# Patient Record
Sex: Female | Born: 1955 | Race: White | Hispanic: No | Marital: Married | Smoking: Former smoker
Health system: Southern US, Community
[De-identification: ages and names within clinical notes are randomized; demographics above are authoritative.]

## PROBLEM LIST (undated history)

## (undated) DIAGNOSIS — D649 Anemia, unspecified: Secondary | ICD-10-CM

## (undated) DIAGNOSIS — G4730 Sleep apnea, unspecified: Secondary | ICD-10-CM

## (undated) DIAGNOSIS — I10 Essential (primary) hypertension: Secondary | ICD-10-CM

## (undated) DIAGNOSIS — E039 Hypothyroidism, unspecified: Secondary | ICD-10-CM

## (undated) DIAGNOSIS — F419 Anxiety disorder, unspecified: Secondary | ICD-10-CM

## (undated) DIAGNOSIS — F32A Depression, unspecified: Secondary | ICD-10-CM

## (undated) DIAGNOSIS — C801 Malignant (primary) neoplasm, unspecified: Secondary | ICD-10-CM

## (undated) DIAGNOSIS — E119 Type 2 diabetes mellitus without complications: Secondary | ICD-10-CM

## (undated) DIAGNOSIS — E079 Disorder of thyroid, unspecified: Secondary | ICD-10-CM

## (undated) DIAGNOSIS — F329 Major depressive disorder, single episode, unspecified: Secondary | ICD-10-CM

## (undated) HISTORY — PX: APPENDECTOMY: SHX54

## (undated) HISTORY — PX: BREAST ENHANCEMENT SURGERY: SHX7

## (undated) HISTORY — PX: BACK SURGERY: SHX140

## (undated) HISTORY — PX: OTHER SURGICAL HISTORY: SHX169

## (undated) HISTORY — PX: ROTATOR CUFF REPAIR: SHX139

---

## 2007-08-14 ENCOUNTER — Inpatient Hospital Stay (HOSPITAL_COMMUNITY): Admission: RE | Admit: 2007-08-14 | Discharge: 2007-08-17 | Payer: Self-pay | Admitting: Orthopaedic Surgery

## 2008-03-28 ENCOUNTER — Ambulatory Visit (HOSPITAL_COMMUNITY): Admission: RE | Admit: 2008-03-28 | Discharge: 2008-03-28 | Payer: Self-pay | Admitting: Orthopaedic Surgery

## 2011-01-22 NOTE — Op Note (Signed)
Lori [name], Lori [name]             ACCOUNT NO.:  [id number]   MEDICAL RECORD NO.:  [id number]          PATIENT TYPE:  INP   LOCATION:  2899                         FACILITY:  [facility]   PHYSICIAN:  Mark C. Ophelia Charter, M.D.    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   PROCEDURE:  L4-5 decompression central with right L4-5 TLIF 9 mm Biomet  PEEK interbody spacer, local bone interbody grafting, bilateral gutter  fusion intertransverse process and pedicle screw instrumentation,  pedicle bone marrow aspirate, VTOSS at L4 and L5.   SURGEON:  Annell Greening, M.D.   ASSISTANT:  Maud Deed, PA-C.   ANESTHESIA:  GOT.   ESTIMATED BLOOD LOSS:  400 mL.   This patient had on-the-job injury with progressive pain, synovial cyst  with spondylolisthesis degenerative in nature and acute synovial cyst  formation causing spinal stenosis with instability and anterolisthesis.  She has had persistent pain initially left leg pain and now more right  leg pain.   The patient was taken to the operating room.  After intubation and Foley  placement placed in the prone position, careful padding and positioning  elbows at 90 with axillary roll padding anterior to the shoulder.  Back  was prepped with DuraPrep after placement of a warmer over the scapular  region by Anesthesia.  Prepping squared with towels, sterile skin  marker, Betadine Vi-Drape and laminectomy sheet was applied.  Time-out  procedure was taken and confirmed.  The patient had Ancef that had  finished dripping in and was started just after intubation.   Midline incision was made.  Subperiosteal dissection onto the spinous  processes  out to the facet joints.  The transverse processes were  identified and C-arm was draped, brought in and confirmed that the  lateral disk was the planned 4-5.  Two Kochers were placed at the  planned level for decompression in line with the pedicle of 4 and also  5.  Central decompression was performed.  There was hypertrophic  ligament and a large synovial cyst which had a combination of synovial  tissue plus granulomatous tissue in addition to hypertrophic ligaments  and some spur formation.  With the patient in the prone position, she  had reduced her degenerative spondylolisthesis and decompression was  performed removing chunks out until the disk space was visualized on  both sides and foramina was enlarged.  On the right side, the facet was  taken out following the disk space out laterally.  Nerve root above was  identified as it slipped underneath the pedicles carefully protected.  There were several very large veins which were coagulated with bipolar  cautery.  Paddies and Gelfoam were placed and diskectomy was performed  with decompression at the 4-5 space checking with C-arm intermittently.  Disk was moved up to the anterior longitudinal ligament.  Anterior  portion  of the disk was intact.  Combination of up angled large  curettes, right and left angled curettes, 7 and 9 box cutter, 7 and 9  chisel cutters, up and straight large pituitaries were all used off of  the [employer].  Once diskectomy was performed, spot photos  taken with the 7 trial in place.  It needed a little bit larger side and  a 9 mm spacer was then inserted with good fit.  The bone that had been  obtained from the laminectomy spinous process removal and removal of the  facet for the TLIF on the right side were meticulously cleaned of soft  tissue by the scrub tech and were carefully packed anteriorly into the  disk space with a bone impactor.  Then the 9-mm spacer was inserted.  There was not enough  bone to pack both anterior and also into the spacer  and still leave enough left for the gutter fusion.  Once the spacer was  in excellent position, it was checked partially going in to visualize  the three dots and then checked after it got completely in place and it  was exactly midline and was in excellent position in lateral.  Next  pedicle screws were inserted using the small pedicle starter checking  under fluoroscopy adjustment followed by the joystick hand advanced  using the MRI scan in the room with visualization of the pedicle angle.  Pedicle feeler after confirmation checking the joystick 40 mm in depth.  Then a 40 mm tap checking with C-arm and then pedicle aspiration of bone  marrow and then placing on the 10 mL VTOSS strip.  There was enough bone  marrow aspirate from three of the four pedicles.  After all pedicle  screws were placed, a 40 mm rod was selected and was first compressed on  the left side which of the opposite side from the right TLIF cage  placement.  Transverse processes were decorticated just prior to the  screw being inserted.  Rod was compressed on the right side and as the  rod tightened down and clicked confirming 110 pounds torque using the  torque wrench, the tip of the torque screwdriver broke off inside the  cap flush with the top of the cap.  The second torque screwdriver was  used and then the bottom side of the second torque screwdriver also  snapped off from metal fatigue.  Both of them had compressed and clicked  at 110 pounds, were extremely tight and the tip of the screwdriver was  meshed into the titanium head stable and had no way of coming out.  The  only way to remove the tip of the screwdriver would have been to remove  the entire screw which would have required dividing the rods.  Since the  screwdriver broke inside the screw, the patient only had one level  disease, all other levels are normal, it was best just to leave the  broken tips  inside.  They are not going to move and go any place.  Using  the screw inserter on the opposite side and a vice grip, the opposite  side was tightened down after being compressed.  Fluoro pictures were  taken AP and lateral showing good position of the cage, good bone  anteriorly, bilateral gutter fusion with the VTOSS and remaining pieces  of bone placed between the intertransverse process and on the up sweep  that had been decorticated with the bur.  The viper retractor  was  carefully removed and deep tissue closed with the 0 Vicryl.  A drain had  planned to be inserted, however, the operative field was dry.  Remaining  paddies and Gelfoam had all been removed and both nerve roots were  checked.  Palpation around all nerve roots showed no areas of  compression and the cage was in excellent position.  The wound was  irrigated.  Deep fascia closed with 0 Vicryl, 2-0 Vicryl subcutaneous  tissue, 4-0 Vicryl subcuticular closure.  Tincture of Benzoin, Steri-  Strips, postop dressing, supine position, extubation and transferred to  recovery room in stable condition.  Instrument and needle count was  correct.      Mark C. Ophelia Charter, M.D.  Electronically Signed     MCY/MEDQ  D:  08/14/2007  T:  08/14/2007  Job:  [id number]

## 2011-01-22 NOTE — Op Note (Signed)
Lori [name], Lori [name]             ACCOUNT NO.:  [id number]   MEDICAL RECORD NO.:  [id number]          PATIENT TYPE:  AMB   LOCATION:  SDS                          FACILITY:  [facility]   PHYSICIAN:  Mark C. Ophelia Charter, M.D.    DATE OF BIRTH:  Nov 07, 1955   DATE OF PROCEDURE:  03/28/2008  DATE OF DISCHARGE:  03/28/2008                               OPERATIVE REPORT   PREOPERATIVE DIAGNOSIS:  Right shoulder biceps tendon tear and rotator  cuff tear.   POSTOPERATIVE DIAGNOSIS:  Right shoulder biceps tendon tear and rotator  cuff tear.   PROCEDURE:  1. Diagnostic and operative arthroscopy, right shoulder; arthroscopic      debridement of biceps tendon tear, labral debridement, and rotator      cuff debridement.  2. Mini-open rotator cuff repair and biceps tenodesis.   SURGEON:  Mark C. Ophelia Charter, MD   ANESTHESIA:  GOT plus preoperative scalene block and 3 mL local.   PROCEDURE NOTE:  After induction of general anesthesia, orotracheal  intubation with preoperative scalene block, the patient was placed in a  beach-chair position, standard prepping and draping was performed.  Time-  out checklist was completed and Ancef was given prophylactically.  Arthroscope was then introduced from posterior approach.  Anterior  portal was made inferior to the biceps tendon.  The biceps tendon  actually showed significant fraying of the patient's subluxed __________  joint was actually tucked down anteroinferiorly with the stump being  caught between the glenohumeral joint inferiorly.  Stump was pulled  loose with a probe, debrided with the shaver.  There was tearing of the  superior labrum.  This was trimmed and smoothed.  There was no  significant glenohumeral arthropathy.   Initially the rotator cuff showed tearing and there was egress of fluid  out and the edge of the rotator cuff was trimmed arthroscopically.  Next, scope was removed.  Sterile skin marker was used.  An incision was  made along the  anterior aspect of the acromion.  Deltoid was peeled off  with electrocautery.  There was a prominent spur which was removed with  three-quarter straight osteotome, rasped underneath.  Distal clavicle  was not prominent.  Small bleeder was present adjacent to the CA  ligament, usual position, which was covered with cautery.  There was  extensive deltoid bursitis and this was trimmed and debrided.  After  debridement, the full-thickness rotator cuff tear was visualized which  was moderate to large in size measuring around 2 cm to 2.5.  Shoulder  was flexed.  Biceps tendon was palpated, delivered with the right-angle  clamp.  K-wire was drilled.  Then, a 5 mm Bio-Tenodesis screw was placed  after a #2 FiberWire was woven through the tendon and secured with 5 x 5-  mm Bio-Tenodesis corkscrew with one end of the suture going through the  corkscrew and once the tendon had been advanced in through the hole, the  screw was countersunk.  Two ends of the suture were tied locking it  down.  Attention was then turned to the rotator cuff.  The attachment  area was debrided  with bare to bleeding bone surface.  Two UltraFix  anchors were placed and then sutures were placed on the tendon pointing  it down to the freshened bone surface.  Deltoid was repaired with  nonabsorbable sutures #2 from the UltraFix repairing the muscle directly  back to the  acromion.  A 2-0 Vicryl subcutaneous tissue and 4-0 Vicryl subcuticular  closure, nylon sutures in the portals.  Instrument count and needle  count was correct.  Postoperative dressing with sling was applied and  the patient was transferred to the recovery room in stable condition.      Mark C. Ophelia Charter, M.D.  Electronically Signed     MCY/MEDQ  D:  03/28/2008  T:  03/29/2008  Job:  [id number]

## 2011-01-25 NOTE — Discharge Summary (Signed)
[name], [name]             ACCOUNT NO.:  [id number]   MEDICAL RECORD NO.:  [id number]          PATIENT TYPE:  INP   LOCATION:  5002                         FACILITY:  [facility]   PHYSICIAN:  Mark C. Ophelia Charter, M.D.    DATE OF BIRTH:  08-10-1956   DATE OF ADMISSION:  08/14/2007  DATE OF DISCHARGE:  08/17/2007                               DISCHARGE SUMMARY   FINAL DIAGNOSIS:  L4-5 instability anterolisthesis.   ADDITIONAL DIAGNOSES:  1. Degenerative spondylolisthesis.  2. Anxiety and depression.  3. Acute blood loss anemia.   ADMISSION LABS:  Mild sinus bradycardia, possible left atrial  enlargement.  CBC:  hemoglobin 14.4, normal differential.  Chemistry  panel normal. Urinalysis normal.  Blood type O positive.   This 55 year old female has had degenerative spondylolisthesis with  spinal stenosis, biforaminal stenosis, this is an isolated level of  degeneration.  She has failed conservative treatment including epidural  steroids.  She has increased pain with activity and paresthesias and L5  nerve root symptoms.  She is admitted after informed consent for one  level L4-5 T lift with pedicle instrumentation, bilateral effusion.   MEDICATIONS:  1. Fioricet, Codeine #3 two p.o. q.5 hours p.r.n. headaches, uses 8      tablets per day x10 years.  2. Protonix 40 mg b.i.d. a.c.  3. Vicodin 5/500 one p.o. b.i.d. p.r.n.  4. Triamterene & hydrochlorothiazide 37.5/25 one p.o. daily.  5. Cymbalta 60 mg daily.  6. Xanax 0.5 mg p.o. t.i.d.  7. Chantix 1 mg one p.o. b.i.d.  8. Fish oil 1200 mg daily.  9. Stool softener 2-3 q.a.m.  10.Megavitamins one p.o. daily.  11.Frova for severe migraine headaches.   HOSPITAL COURSE:  The patient was admitted, underwent central  decompression L4-5, pedicle screw instrumentation with Bio Met Polaris  instrumentation.  Pedicle aspiration and bilateral gutter fusions with  local bone.  Hemoglobin postoperatively was 9.6.  Sodium was 129.  She  had  some decrease in her p.o. fluids and her sodium improved.  She was  ambulatory with her brace with physical therapy.  She received  mechanical prophylaxis for DVT prevention.  She was walking in the  halls, taking OxyContin b.i.d., Oxi-IR for breakthrough pain, Robaxin.  X-rays showed excellent position and alignment task.  She had good  relief from her preoperative L5 nerve root symptoms.  Incision was dry.  Office followup 1 week post discharge.   FINAL DIAGNOSES:  1. Instability.  2. Acute blood loss anemia.  3. Degenerative spondylolisthesis.      Mark C. Ophelia Charter, M.D.  Electronically Signed     MCY/MEDQ  D:  09/14/2007  T:  09/14/2007  Job:  [id number]

## 2011-06-07 LAB — DIFFERENTIAL
Basophils Relative: 1
Eosinophils Absolute: 0.1
Eosinophils Relative: 1
Monocytes Absolute: 0.8
Neutro Abs: 4.7
Neutrophils Relative %: 53

## 2011-06-07 LAB — URINALYSIS, ROUTINE W REFLEX MICROSCOPIC
Bilirubin Urine: NEGATIVE
Glucose, UA: NEGATIVE
Hgb urine dipstick: NEGATIVE
Specific Gravity, Urine: 1.011
Urobilinogen, UA: 0.2
pH: 7

## 2011-06-07 LAB — COMPREHENSIVE METABOLIC PANEL
Albumin: 3.6
BUN: 9
Calcium: 9.4
Chloride: 92 — ABNORMAL LOW
Potassium: 4.1

## 2011-06-07 LAB — CBC
Hemoglobin: 13.8
MCV: 90.8
Platelets: 271
RDW: 15.1

## 2011-06-07 LAB — PROTIME-INR: INR: 0.9

## 2011-06-17 LAB — HEMOGLOBIN AND HEMATOCRIT, BLOOD
HCT: 28.3 — ABNORMAL LOW
Hemoglobin: 10.9 — ABNORMAL LOW

## 2011-06-17 LAB — COMPREHENSIVE METABOLIC PANEL
AST: 20
Albumin: 3.6
Alkaline Phosphatase: 101
CO2: 29
Chloride: 99
Creatinine, Ser: 0.62
Glucose, Bld: 80
Potassium: 3.8
Sodium: 134 — ABNORMAL LOW

## 2011-06-17 LAB — URINALYSIS, ROUTINE W REFLEX MICROSCOPIC
Glucose, UA: NEGATIVE
Hgb urine dipstick: NEGATIVE
Ketones, ur: NEGATIVE
Specific Gravity, Urine: 1.011

## 2011-06-17 LAB — BASIC METABOLIC PANEL
BUN: 7
CO2: 28
Chloride: 101
Creatinine, Ser: 0.53
Creatinine, Ser: 0.7
GFR calc Af Amer: >60

## 2011-06-17 LAB — CBC
MCV: 92.7
RDW: 12.9
WBC: 6.8

## 2011-06-17 LAB — TYPE AND SCREEN: ABO/RH(D): O POS

## 2011-06-17 LAB — ABO/RH: ABO/RH(D): O POS

## 2011-06-17 LAB — PROTIME-INR
INR: 0.9
Prothrombin Time: 12.2

## 2011-06-17 LAB — DIFFERENTIAL
Lymphocytes Relative: 44
Lymphs Abs: 3
Neutrophils Relative %: 43

## 2014-06-06 ENCOUNTER — Ambulatory Visit (INDEPENDENT_AMBULATORY_CARE_PROVIDER_SITE_OTHER): Payer: BLUE CROSS/BLUE SHIELD

## 2014-06-06 ENCOUNTER — Telehealth: Payer: Self-pay | Admitting: *Deleted

## 2014-06-06 ENCOUNTER — Ambulatory Visit (INDEPENDENT_AMBULATORY_CARE_PROVIDER_SITE_OTHER): Payer: BLUE CROSS/BLUE SHIELD | Admitting: Sports Medicine

## 2014-06-06 ENCOUNTER — Encounter: Payer: Self-pay | Admitting: Sports Medicine

## 2014-06-06 VITALS — BP 97/62 | HR 79 | Ht 60.0 in | Wt 139.0 lb

## 2014-06-06 DIAGNOSIS — Q762 Congenital spondylolisthesis: Secondary | ICD-10-CM | POA: Diagnosis not present

## 2014-06-06 DIAGNOSIS — R209 Unspecified disturbances of skin sensation: Secondary | ICD-10-CM

## 2014-06-06 DIAGNOSIS — R202 Paresthesia of skin: Secondary | ICD-10-CM | POA: Insufficient documentation

## 2014-06-06 DIAGNOSIS — R200 Anesthesia of skin: Secondary | ICD-10-CM | POA: Insufficient documentation

## 2014-06-06 MED ORDER — GABAPENTIN 300 MG PO CAPS: ORAL_CAPSULE | ORAL | Status: AC

## 2014-06-06 NOTE — Progress Notes (Signed)
   Subjective:    I'm seeing this patient as a consultation for:  Dr. Trudi Ida  CC: Left toe numbness  HPI: This is a very pleasant 58 year old female who comes in with a long history of numbness on her left and occasionally right second toes. The numbness does travel from her back. She has seen a couple of podiatrists who have recommended surgical procedures on her toes. She had an injection sometime ago that also resulted in mild temporary relief. Numbness and tingling as well as a burning sensation is worse with driving a car and sitting for long periods of time. On further questioning she does have diabetes mellitus type 2 of unknown control, and has a history of an L4-L5 lumbar discectomy with fusion, she had good temporary relief but unfortunately a recurrence of back pain after surgery. Pain is moderate, persistent.  Past medical history, Surgical history, Family history not pertinant except as noted below, Social history, Allergies, and medications have been entered into the medical record, reviewed, and no changes needed.   Review of Systems: No headache, visual changes, nausea, vomiting, diarrhea, constipation, dizziness, abdominal pain, skin rash, fevers, chills, night sweats, weight loss, swollen lymph nodes, body aches, joint swelling, muscle aches, chest pain, shortness of breath, mood changes, visual or auditory hallucinations.   Objective:   General: Well Developed, well nourished, and in no acute distress.  Neuro/Psych: Alert and oriented x3, extra-ocular muscles intact, able to move all 4 extremities, sensation grossly intact. Skin: Warm and dry, no rashes noted.  Respiratory: Not using accessory muscles, speaking in full sentences, trachea midline.  Cardiovascular: Pulses palpable, no extremity edema. Abdomen: Does not appear distended. Bilateral feet: No visible erythema or swelling. Range of motion is full in all directions. Strength is 5/5 in all directions. No  hallux valgus. No pes cavus or pes planus. No abnormal callus noted. No pain over the navicular prominence, or base of fifth metatarsal. No tenderness to palpation of the calcaneal insertion of plantar fascia. No pain at the Achilles insertion. No pain over the calcaneal bursa. No pain of the retrocalcaneal bursa. No tenderness to palpation over the tarsals, metatarsals, or phalanges. No hallux rigidus or limitus. No tenderness palpation over interphalangeal joints. No pain, numbness, tingling, or burning with compression of the metatarsal heads. Neurovascularly intact distally.  X-rays reviewed and do show hardware at L4-L5 with persistent 4 mm anterolisthesis of L4 on L5.  Impression and Recommendations:   This case required medical decision making of moderate complexity.

## 2014-06-06 NOTE — Assessment & Plan Note (Signed)
This is bilateral, left worse than right into the second toe, worse when sitting for long periods of time in a patient with a history of an L4-L5 fusion continued back pain, and diabetes. Unclear control, we're going to check a CBC, metabolic panel, M1O, TSH, and B12. As she is post lumbar fusion we are also going to obtain a new lumbar spine x-ray and MRI with IV contrast. Starting gabapentin for symptom relief. Stop metformin 48 hours before MRI. Return to see me to go over MRI results.

## 2014-06-06 NOTE — Telephone Encounter (Signed)
MRI auth approval [id number] expires 07/05/14. Margette Fast, CMA

## 2014-06-06 NOTE — Patient Instructions (Signed)
Please stop metformin 48 hours before your MRI.

## 2014-06-07 LAB — COMPREHENSIVE METABOLIC PANEL
ALT: 120 U/L — ABNORMAL HIGH (ref 0–35)
BUN: 14 mg/dL (ref 6–23)
CO2: 26 mEq/L (ref 19–32)
Calcium: 8.8 mg/dL (ref 8.4–10.5)
Chloride: 98 mEq/L (ref 96–112)
Creat: 0.69 mg/dL (ref 0.50–1.10)
Glucose, Bld: 76 mg/dL (ref 70–99)

## 2014-06-07 LAB — COMPREHENSIVE METABOLIC PANEL WITH GFR
AST: 331 U/L — ABNORMAL HIGH (ref 0–37)
Albumin: 3.7 g/dL (ref 3.5–5.2)
Alkaline Phosphatase: 75 U/L (ref 39–117)
Potassium: 3.4 meq/L — ABNORMAL LOW (ref 3.5–5.3)
Sodium: 135 meq/L (ref 135–145)
Total Bilirubin: 0.4 mg/dL (ref 0.2–1.2)
Total Protein: 6.2 g/dL (ref 6.0–8.3)

## 2014-06-07 LAB — CBC
HCT: 37.9 % (ref 36.0–46.0)
Hemoglobin: 13.1 g/dL (ref 12.0–15.0)
MCH: 31.5 pg (ref 26.0–34.0)
MCHC: 34.6 g/dL (ref 30.0–36.0)
MCV: 91.1 fL (ref 78.0–100.0)
Platelets: 204 10*3/uL (ref 150–400)
RBC: 4.16 MIL/uL (ref 3.87–5.11)
RDW: 13.8 % (ref 11.5–15.5)
WBC: 4.1 10*3/uL (ref 4.0–10.5)

## 2014-06-07 LAB — TSH: TSH: 1.926 u[IU]/mL (ref 0.350–4.500)

## 2014-06-07 LAB — HEMOGLOBIN A1C
Hgb A1c MFr Bld: 5.3 % (ref <5.7)
Mean Plasma Glucose: 105 mg/dL (ref <117)

## 2014-06-07 LAB — VITAMIN B12: Vitamin B-12: 862 pg/mL (ref 211–911)

## 2014-06-13 ENCOUNTER — Ambulatory Visit (INDEPENDENT_AMBULATORY_CARE_PROVIDER_SITE_OTHER): Payer: BC Managed Care – PPO

## 2014-06-13 DIAGNOSIS — M4326 Fusion of spine, lumbar region: Secondary | ICD-10-CM

## 2014-06-13 MED ORDER — GADOBENATE DIMEGLUMINE 529 MG/ML IV SOLN
12.0000 mL | Freq: Once | INTRAVENOUS | Status: AC | PRN
  Administered 2014-06-13: 12 mL via INTRAVENOUS

## 2014-06-14 ENCOUNTER — Ambulatory Visit (INDEPENDENT_AMBULATORY_CARE_PROVIDER_SITE_OTHER): Payer: BLUE CROSS/BLUE SHIELD | Admitting: Sports Medicine

## 2014-06-14 VITALS — BP 131/88 | HR 68 | Ht 60.0 in | Wt 139.0 lb

## 2014-06-14 DIAGNOSIS — R202 Paresthesia of skin: Secondary | ICD-10-CM

## 2014-06-14 NOTE — Progress Notes (Signed)
  Subjective:    CC: Followup MRI results  HPI: This is a very pleasant 58 year old female with lumbar degenerative disc disease post L4-L5 fusion. Initially she did well but then had a recurrence of pain. Pain was in the back, worse with sitting and with flexion, with radiation down both legs but are dominantly down the right leg over the dorsum of the foot to the second toe, and over the plantar aspect of the foot, i.e. in an L5 distribution.  She has already been there in the past, we added gabapentin in a taper, she is now at approximately 300 mg twice a day and not yet noting much response. We also obtained an MRI in anticipation of interventional injection planning. She does have a history of diabetes mellitus type 2, but her hemoglobin A1c was very well controlled at the last check several weeks ago, so it's unlikely that this represents diabetic peripheral neuropathy. Metabolic panel, U38, CBC, and TSH levels were normal.  Past medical history, Surgical history, Family history not pertinant except as noted below, Social history, Allergies, and medications have been entered into the medical record, reviewed, and no changes needed.   Review of Systems: No fevers, chills, night sweats, weight loss, chest pain, or shortness of breath.   Objective:    General: Well Developed, well nourished, and in no acute distress.  Neuro: Alert and oriented x3, extra-ocular muscles intact, sensation grossly intact.  HEENT: Normocephalic, atraumatic, pupils equal round reactive to light, neck supple, no masses, no lymphadenopathy, thyroid nonpalpable.  Skin: Warm and dry, no rashes. Cardiac: Regular rate and rhythm, no murmurs rubs or gallops, no lower extremity edema.  Respiratory: Clear to auscultation bilaterally. Not using accessory muscles, speaking in full sentences.  MRI was personally reviewed, there is severe susceptibility artifact from her L4-L5 fusion apparatus, I am able to make out a  broad-based L5-S1 disc protrusion.  Impression and Recommendations:

## 2014-06-14 NOTE — Assessment & Plan Note (Signed)
Symptoms do likely represent left-sided lumbar radiculitis and L5 distribution, and likely represents adjacent level disease with a history of an L4-L5 fusion. Gabapentin has been ineffective at the current low dose, I have advised that she continue the up taper. She does have a normal CBC, metabolic panel, hemoglobin A1c, B12, and TSH levels. At this point I do think we need to proceed with a lumbar epidural, ideally on the left side at the L5-S1 level, transforaminal ideally. We could do this here however she does have an interventional pain management physician in [place], I would request that they try a left-sided L5-S1 transforaminal epidural. I would like to see her back a couple of weeks afterwards, or she can continue followup with them. I have asked her to obtain an MRI CD from radiology downstairs.

## 2015-07-09 ENCOUNTER — Emergency Department (INDEPENDENT_AMBULATORY_CARE_PROVIDER_SITE_OTHER)
Admission: EM | Admit: 2015-07-09 | Discharge: 2015-07-09 | Disposition: A | Discharge status: Home / Self Care | Payer: BLUE CROSS/BLUE SHIELD | Source: Home / Self Care

## 2015-07-09 ENCOUNTER — Encounter: Payer: Self-pay | Admitting: Emergency Medicine

## 2015-07-09 ENCOUNTER — Emergency Department (INDEPENDENT_AMBULATORY_CARE_PROVIDER_SITE_OTHER): Payer: BLUE CROSS/BLUE SHIELD

## 2015-07-09 DIAGNOSIS — J181 Lobar pneumonia, unspecified organism: Principal | ICD-10-CM

## 2015-07-09 DIAGNOSIS — R079 Chest pain, unspecified: Secondary | ICD-10-CM

## 2015-07-09 DIAGNOSIS — J189 Pneumonia, unspecified organism: Secondary | ICD-10-CM | POA: Diagnosis not present

## 2015-07-09 HISTORY — DX: Disorder of thyroid, unspecified: E07.9

## 2015-07-09 HISTORY — DX: Type 2 diabetes mellitus without complications: E11.9

## 2015-07-09 HISTORY — DX: Essential (primary) hypertension: I10

## 2015-07-09 MED ORDER — CEFDINIR 300 MG PO CAPS: 300.0000 mg | ORAL_CAPSULE | Freq: Two times a day (BID) | ORAL | Status: DC

## 2015-07-09 NOTE — ED Provider Notes (Signed)
CSN: [id number]     Arrival date & time 07/09/15  1118 History   None    Chief Complaint  Patient presents with  . Chest Pain      HPI Comments: Patient reports onset of pain in her left chest underneath her breast yesterday, worse with movement, cough, and deep inspiration.  She denies cough however.  No shortness of breath.  No fevers, chills, and sweats.  No recent URI. She states that she had pneumonia about 10 years ago.   The history is provided by the patient.    Past Medical History  Diagnosis Date  . Hypertension   . Thyroid disease   . Diabetes mellitus without complication ([place])    History reviewed. No pertinent past surgical history. Family History  Problem Relation Age of Onset  . Hypertension Father   . Cancer Sister    Social History  Substance Use Topics  . Smoking status: Never Smoker   . Smokeless tobacco: None  . Alcohol Use: No   OB History    No data available     Review of Systems No sore throat No cough No pleuritic pain No wheezing No nasal congestion No post-nasal drainage No sinus pain/pressure No itchy/red eyes No earache No hemoptysis No SOB No fever/chills No nausea No vomiting No abdominal pain No diarrhea No urinary symptoms No skin rash + fatigue No myalgias No headache Used OTC meds without relief  Allergies  Review of patient's allergies indicates no known allergies.  Home Medications   Prior to Admission medications   Medication Sig Start Date End Date Taking? Authorizing Provider  ALPRAZolam Duanne Moron) 0.5 MG tablet Take 0.5 mg by mouth at bedtime as needed for anxiety.    Historical Provider, MD  Armodafinil (NUVIGIL) 150 MG tablet Take 150 mg by mouth daily.    Historical Provider, MD  Butalbital-APAP-Caffeine 50-300-40 MG CAPS Take 2 capsules by mouth every 6 (six) hours.    Historical Provider, MD  Calcium Citrate (CITRACAL PO) Take by mouth.    Historical Provider, MD  cefdinir (OMNICEF) 300 MG capsule Take 1  capsule (300 mg total) by mouth 2 (two) times daily. 07/09/15   Kandra Nicolas, MD  Cholecalciferol (VITAMIN D) 2000 UNITS CAPS Take by mouth.    Historical Provider, MD  denosumab (PROLIA) 60 MG/ML SOLN injection Inject 60 mg into the skin every 6 (six) months. Administer in upper arm, thigh, or abdomen    Historical Provider, MD  gabapentin (NEURONTIN) 300 MG capsule One tab PO qHS for a week, then BID for a week, then TID. May double weekly to a max of 3,600mg /day 06/06/14   Silverio Decamp, MD  HYDROcodone-acetaminophen [facility]) 10-325 MG per tablet Take 1 tablet by mouth every 6 (six) hours as needed.    Historical Provider, MD  levothyroxine (SYNTHROID, LEVOTHROID) 25 MCG tablet Take 25 mcg by mouth daily before breakfast.    Historical Provider, MD  metFORMIN (GLUCOPHAGE) 500 MG tablet  02/02/14   Historical Provider, MD  Omega-3 Fatty Acids (FISH OIL) 1000 MG CAPS Take by mouth.    Historical Provider, MD  rizatriptan (MAXALT) 5 MG tablet Take 5 mg by mouth as needed for migraine. May repeat in 2 hours if needed    Historical Provider, MD  tizanidine (ZANAFLEX) 2 MG capsule Take 2 mg by mouth 3 (three) times daily.    Historical Provider, MD  valsartan (DIOVAN) 80 MG tablet Take 80 mg by mouth daily.  Historical Provider, MD  venlafaxine XR (EFFEXOR-XR) 150 MG 24 hr capsule Take 150 mg by mouth daily with breakfast.    Historical Provider, MD   Meds Ordered and Administered this Visit  Medications - No data to display  BP 90/56 mmHg  Pulse 78  Temp(Src) 98.3 F (36.8 C) (Oral)  Resp 16  Ht 5' (1.524 m)  Wt 144 lb 8 oz (65.545 kg)  BMI 28.22 kg/m2  SpO2 97% No data found.   Physical Exam  Constitutional: She is oriented to person, place, and time. She appears well-developed and well-nourished. No distress.  HENT:  Head: Normocephalic.  Right Ear: Tympanic membrane and ear canal normal.  Left Ear: Tympanic membrane and ear canal normal.  Nose: Nose normal.    Mouth/Throat: Oropharynx is clear and moist.  Eyes: Conjunctivae and EOM are normal. Pupils are equal, round, and reactive to light.  Neck: Neck supple. No JVD present.  Cardiovascular: Normal heart sounds.   Pulmonary/Chest: Breath sounds normal. No stridor. No respiratory distress. She has no wheezes. She has no rales. She exhibits tenderness.    There is mild tenderness to palpation over the left anterior lower ribs as noted on diagram.    Abdominal: Soft. She exhibits no mass. There is no tenderness.  Musculoskeletal: She exhibits no edema or tenderness.  Lymphadenopathy:    She has no cervical adenopathy.  Neurological: She is alert and oriented to person, place, and time.  Skin: Skin is warm and dry. No pallor.  Nursing note and vitals reviewed.   ED Course  Procedures  None  Imaging Review Dg Ribs Unilateral W/chest Left  07/09/2015  CLINICAL DATA:  Pain in LEFT anterior ribs. Pain with deep inspiration EXAM: LEFT RIBS AND CHEST - 3+ VIEW COMPARISON:  None. FINDINGS: Normal cardiac silhouette. Bilateral breast implants noted. There is a airspace density in the LEFT upper lobe obscuring the LEFT heart border. Dedicated views of the LEFT ribs no acute fractures. Healed fracture of the LEFT lateral eighth rib. IMPRESSION: 1. LEFT upper lobe pneumonia. Followup PA and lateral chest X-ray is recommended in 3-4 weeks following trial of antibiotic therapy to ensure resolution and exclude underlying malignancy. 2 . no evidence of acute rib fracture. Electronically Signed   By: Suzy Bouchard M.D.   On: 07/09/2015 12:13      MDM   1. Left upper lobe pneumonia    Begin Omnicef 300mg  BID for 10 days. Take plain guaifenesin (1200mg  extended release tabs such as Mucinex) twice daily, with plenty of water, for cough and congestion.   May take Tylenol or Norco as needed for pain. Followup with family doctor if not improved in 10 days. Patient prefers to return here in 3 weeks for  repeat chest X-ray. If symptoms become significantly worse during the night or over the weekend, proceed to the local emergency room.     Kandra Nicolas, MD 07/12/15 [phone]

## 2015-07-09 NOTE — ED Notes (Signed)
Reports onset of pain in muscle/rib area under left breast yesterday; unable to sleep last night; did do one exercise that may have contributed to discomfort. No SOB, dyspnea, diaphoresis.

## 2015-07-09 NOTE — Discharge Instructions (Signed)
Take plain guaifenesin (1200mg  extended release tabs such as Mucinex) twice daily, with plenty of water, for cough and congestion.   May take Tylenol or Norco as needed for pain. Followup with family doctor if not improved in 10 days. If symptoms become significantly worse during the night or over the weekend, proceed to the local emergency room.

## 2015-07-13 ENCOUNTER — Telehealth: Payer: Self-pay | Admitting: *Deleted

## 2015-07-17 ENCOUNTER — Ambulatory Visit: Payer: Self-pay | Admitting: "Endocrinology

## 2015-08-01 LAB — HEMOGLOBIN A1C: Hgb A1c MFr Bld: 5.9 % (ref 4.0–6.0)

## 2015-08-01 LAB — BASIC METABOLIC PANEL: SODIUM: 139 mmol/L (ref 137–147)

## 2015-08-07 ENCOUNTER — Encounter: Payer: Self-pay | Admitting: "Endocrinology

## 2015-08-07 ENCOUNTER — Ambulatory Visit (INDEPENDENT_AMBULATORY_CARE_PROVIDER_SITE_OTHER): Payer: BLUE CROSS/BLUE SHIELD | Admitting: "Endocrinology

## 2015-08-07 VITALS — BP 127/89 | HR 74 | Ht 60.0 in | Wt 138.0 lb

## 2015-08-07 DIAGNOSIS — E119 Type 2 diabetes mellitus without complications: Secondary | ICD-10-CM

## 2015-08-07 DIAGNOSIS — R631 Polydipsia: Secondary | ICD-10-CM

## 2015-08-07 DIAGNOSIS — E039 Hypothyroidism, unspecified: Secondary | ICD-10-CM

## 2015-08-07 NOTE — Progress Notes (Signed)
Subjective:    Patient ID: Lori [name], female    DOB: 15-Apr-1956, PCP Talmage Coin, MD   Past Medical History  Diagnosis Date  . Hypertension   . Thyroid disease   . Diabetes mellitus without complication [facility])    Past Surgical History  Procedure Laterality Date  . Rotator cuff repair    . Breast enhancement surgery    . Cesarean section    . Appendectomy     Social History   Social History  . Marital Status: Married    Spouse Name: N/A  . Number of Children: N/A  . Years of Education: N/A   Social History Main Topics  . Smoking status: Never Smoker   . Smokeless tobacco: None  . Alcohol Use: No  . Drug Use: No  . Sexual Activity: Not Asked   Other Topics Concern  . None   Social History Narrative   Outpatient Encounter Prescriptions as of 08/07/2015  Medication Sig  . levothyroxine (SYNTHROID, LEVOTHROID) 50 MCG tablet Take 50 mcg by mouth daily before breakfast.  . metFORMIN (GLUCOPHAGE) 500 MG tablet   . valsartan (DIOVAN) 80 MG tablet Take 80 mg by mouth daily.  Marland Kitchen ALPRAZolam (XANAX) 0.5 MG tablet Take 0.5 mg by mouth at bedtime as needed for anxiety.  . Armodafinil (NUVIGIL) 150 MG tablet Take 150 mg by mouth daily.  . Butalbital-APAP-Caffeine 50-300-40 MG CAPS Take 2 capsules by mouth every 6 (six) hours.  . Calcium Citrate (CITRACAL PO) Take by mouth.  . cefdinir (OMNICEF) 300 MG capsule Take 1 capsule (300 mg total) by mouth 2 (two) times daily.  . Cholecalciferol (VITAMIN D) 2000 UNITS CAPS Take by mouth.  . denosumab (PROLIA) 60 MG/ML SOLN injection Inject 60 mg into the skin every 6 (six) months. Administer in upper arm, thigh, or abdomen  . gabapentin (NEURONTIN) 300 MG capsule One tab PO qHS for a week, then BID for a week, then TID. May double weekly to a max of 3,600mg /day  . HYDROcodone-acetaminophen (NORCO) 10-325 MG per tablet Take 1 tablet by mouth every 6 (six) hours as needed.  . Omega-3 Fatty Acids (FISH OIL) 1000 MG CAPS Take by  mouth.  . rizatriptan (MAXALT) 5 MG tablet Take 5 mg by mouth as needed for migraine. May repeat in 2 hours if needed  . tizanidine (ZANAFLEX) 2 MG capsule Take 2 mg by mouth 3 (three) times daily.  Marland Kitchen venlafaxine XR (EFFEXOR-XR) 150 MG 24 hr capsule Take 150 mg by mouth daily with breakfast.  . [DISCONTINUED] levothyroxine (SYNTHROID, LEVOTHROID) 25 MCG tablet Take 25 mcg by mouth daily before breakfast.   No facility-administered encounter medications on file as of 08/07/2015.   ALLERGIES: Allergies  Allergen Reactions  . Celebrex [Celecoxib]   . Lisinopril    VACCINATION STATUS:  There is no immunization history on file for this patient.  HPI  59 yr old female with medical hx significant for Osteoporosis, arthritis, depression, anxiety, PUD, GERD, chronic pain, sleep disorder, polypharmacy, chronic smoking and hyponatremia. 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  She is on fluid restriction to <1500cc/day.  She feels much better.  No new complaints. Her serum Sodium has stabilized at 138. She is now on Velnafaxine, Nuvigil, Xanax, Maxalt. She denies dry mouth now. She has steady weight .  she is on MTF for type 2 DM and Lt4  50 mcg po qday for hypothyroidism. She has never been treated  with Lithium, demeclocycline, nor Vasopressin.  Review of Systems  Constitutional: no weight gain/loss, no fatigue, no subjective hyperthermia/hypothermia Eyes: no blurry vision, no xerophthalmia ENT: no sore throat, no nodules palpated in throat, no dysphagia/odynophagia, no hoarseness Cardiovascular: no CP/SOB/palpitations/leg swelling Respiratory: no cough/SOB Gastrointestinal: no N/V/D/C Musculoskeletal: no muscle/joint aches Skin: no rashes Neurological: no tremors/numbness/tingling/dizziness Psychiatric: no depression/anxiety  Objective:    BP 127/89 mmHg  Pulse 74  Ht 5' (1.524 m)  Wt 138 lb (62.596 kg)  BMI 26.95 kg/m2  SpO2  98%  Wt Readings from Last 3 Encounters:  08/07/15 138 lb (62.596 kg)  07/09/15 144 lb 8 oz (65.545 kg)  06/14/14 139 lb (63.05 kg)    Physical Exam  Constitutional:  in NAD Eyes: PERRLA, EOMI, no exophthalmos ENT: moist mucous membranes, no thyromegaly, no cervical lymphadenopathy Cardiovascular: RRR, No MRG Respiratory: CTA B Gastrointestinal: abdomen soft, NT, ND, BS+ Musculoskeletal: no deformities, strength intact in all 4 Skin: moist, warm, no rashes Neurological: no tremor with outstretched hands, DTR normal in all 4  Labs from 08/01/15: Free t4 1.19, TSH 1.94, a1c , 5.9, Na 138.  Assessment & Plan:   1. Primary hypothyroidism  - Continue LT4 50 mcg po qday for hypothyroidism. -we discussed the correct way to take her Lt4 in the AM.  2. Diabetes mellitus without complication ([place]) Her [id number] is stable  at 5.9% c/w controlled type 2 DM. I gave her detailed diabetes education and she agrees to go on metformin 500mg  po qday. she will adopt CHO restricted diet.   3. Polydipsia  Her most recent Sodium is stable at 138. Continue moderate fluid restriction to 1200cc/24 hours. 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 She is clinically euvolemic and asymptomatic today.  Pt's prior 24 hour urine Aldosterone and cortisol were within normal limits.   Her BP is better , she reacted to Lisinopril, but doing well on Norvasc.  - I advised patient to maintain close follow up with Talmage Coin, MD for primary care needs.  Follow up plan: Return for underactive thyroid, diabetes, follow up with pre-visit labs.  Glade Lloyd, MD Phone: [phone]  Fax: [phone]   08/07/2015, 8:54 PM

## 2015-08-18 ENCOUNTER — Other Ambulatory Visit: Payer: Self-pay | Admitting: "Endocrinology

## 2015-08-21 NOTE — Telephone Encounter (Signed)
Pt is requesting refill for metformin 500mg  bid. The note states she should only be taking once a day.

## 2015-10-30 ENCOUNTER — Other Ambulatory Visit: Payer: Self-pay | Admitting: "Endocrinology

## 2015-12-20 ENCOUNTER — Other Ambulatory Visit: Payer: Self-pay | Admitting: "Endocrinology

## 2016-01-26 ENCOUNTER — Other Ambulatory Visit: Payer: Self-pay | Admitting: "Endocrinology

## 2016-02-05 ENCOUNTER — Ambulatory Visit: Payer: BLUE CROSS/BLUE SHIELD | Admitting: "Endocrinology

## 2016-02-13 LAB — HEMOGLOBIN A1C: HEMOGLOBIN A1C: 6

## 2016-02-19 ENCOUNTER — Ambulatory Visit (INDEPENDENT_AMBULATORY_CARE_PROVIDER_SITE_OTHER): Payer: BLUE CROSS/BLUE SHIELD | Admitting: "Endocrinology

## 2016-02-19 ENCOUNTER — Encounter: Payer: Self-pay | Admitting: "Endocrinology

## 2016-02-19 VITALS — BP 110/78 | HR 82 | Ht 60.0 in | Wt 138.0 lb

## 2016-02-19 DIAGNOSIS — E119 Type 2 diabetes mellitus without complications: Secondary | ICD-10-CM

## 2016-02-19 DIAGNOSIS — R631 Polydipsia: Secondary | ICD-10-CM

## 2016-02-19 DIAGNOSIS — E039 Hypothyroidism, unspecified: Secondary | ICD-10-CM

## 2016-02-19 NOTE — Progress Notes (Signed)
Subjective:    Patient ID: Lori [name], female    DOB: [birth year], PCP Talmage Coin, MD   Past Medical History  Diagnosis Date  . Hypertension   . Thyroid disease   . Diabetes mellitus without complication [facility])    Past Surgical History  Procedure Laterality Date  . Rotator cuff repair    . Breast enhancement surgery    . Cesarean section    . Appendectomy     Social History   Social History  . Marital Status: Married    Spouse Name: N/A  . Number of Children: N/A  . Years of Education: N/A   Social History Main Topics  . Smoking status: Never Smoker   . Smokeless tobacco: None  . Alcohol Use: No  . Drug Use: No  . Sexual Activity: Not Asked   Other Topics Concern  . None   Social History Narrative   Outpatient Encounter Prescriptions as of 02/19/2016  Medication Sig  . tiZANidine (ZANAFLEX) 2 MG tablet Take by mouth as directed.  Marland Kitchen ALPRAZolam (XANAX) 0.5 MG tablet Take 0.5 mg by mouth at bedtime as needed for anxiety.  . Butalbital-APAP-Caffeine 50-300-40 MG CAPS Take 2 capsules by mouth every 6 (six) hours.  . Calcium Citrate (CITRACAL PO) Take by mouth.  . Cholecalciferol (VITAMIN D) 2000 UNITS CAPS Take by mouth.  . denosumab (PROLIA) 60 MG/ML SOLN injection Inject 60 mg into the skin every 6 (six) months. Administer in upper arm, thigh, or abdomen  . gabapentin (NEURONTIN) 300 MG capsule One tab PO qHS for a week, then BID for a week, then TID. May double weekly to a max of 3,600mg /day  . HYDROcodone-acetaminophen (NORCO) 10-325 MG per tablet Take 1 tablet by mouth every 6 (six) hours as needed.  Marland Kitchen levothyroxine (SYNTHROID, LEVOTHROID) 50 MCG tablet TAKE 1 TABLET EVERY MORNING  . metFORMIN (GLUCOPHAGE) 500 MG tablet TAKE 1 TABLET TWICE A DAY (Patient taking differently: once daily)  . Omega-3 Fatty Acids (FISH OIL) 1000 MG CAPS Take by mouth.  . rizatriptan (MAXALT) 5 MG tablet Take 5 mg by mouth as needed for migraine. May repeat in 2 hours if  needed  . valsartan (DIOVAN) 80 MG tablet Take 80 mg by mouth daily.  Marland Kitchen venlafaxine XR (EFFEXOR-XR) 150 MG 24 hr capsule Take 150 mg by mouth daily with breakfast.  . [DISCONTINUED] Armodafinil (NUVIGIL) 150 MG tablet Take 150 mg by mouth daily.  . [DISCONTINUED] cefdinir (OMNICEF) 300 MG capsule Take 1 capsule (300 mg total) by mouth 2 (two) times daily.  . [DISCONTINUED] tizanidine (ZANAFLEX) 2 MG capsule Take 2 mg by mouth 3 (three) times daily.   No facility-administered encounter medications on file as of 02/19/2016.   ALLERGIES: Allergies  Allergen Reactions  . Celebrex [Celecoxib]   . Lisinopril    VACCINATION STATUS:  There is no immunization history on file for this patient.  HPI  60 yr old female with medical hx significant for Osteoporosis, arthritis, depression, anxiety, PUD, GERD, chronic pain, sleep disorder, polypharmacy, chronic smoking and hyponatremia. 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  She is on fluid restriction to <1500cc/day.  She feels much better.  No new complaints. Her serum Sodium has stabilized at 138. She is now on Velnafaxine, Nuvigil, Xanax, Maxalt. She denies dry mouth now. She has steady weight .  she is on MTF for type 2 DM and Lt4  50 mcg po qday for hypothyroidism.  She has never been treated with Lithium, demeclocycline, nor Vasopressin.  Review of Systems  Constitutional: no weight gain/loss, no fatigue, no subjective hyperthermia/hypothermia Eyes: no blurry vision, no xerophthalmia ENT: no sore throat, no nodules palpated in throat, no dysphagia/odynophagia, no hoarseness Cardiovascular: no CP/SOB/palpitations/leg swelling Respiratory: no cough/SOB Gastrointestinal: no N/V/D/C Musculoskeletal: no muscle/joint aches Skin: no rashes Neurological: no tremors/numbness/tingling/dizziness Psychiatric: no depression/anxiety  Objective:    BP 110/78 mmHg  Pulse 82  Ht 5' (1.524 m)   Wt 138 lb (62.596 kg)  BMI 26.95 kg/m2  SpO2 95%  Wt Readings from Last 3 Encounters:  02/19/16 138 lb (62.596 kg)  08/07/15 138 lb (62.596 kg)  07/09/15 144 lb 8 oz (65.545 kg)    Physical Exam  Constitutional:  in NAD Eyes: PERRLA, EOMI, no exophthalmos ENT: moist mucous membranes, no thyromegaly, no cervical lymphadenopathy Cardiovascular: RRR, No MRG Respiratory: CTA B Gastrointestinal: abdomen soft, NT, ND, BS+ Musculoskeletal: no deformities, strength intact in all 4 Skin: moist, warm, no rashes Neurological: no tremor with outstretched hands, DTR normal in all 4  Labs from 02/13/2016 showed A1c is 6%, free T4 1.02, TSH 0.85, sodium 138, BUN 13, creatinine 0.78 Labs from 08/01/15: Free t4 1.19, TSH 1.94, a1c , 5.9, Na 138.  Assessment & Plan:   1. Primary hypothyroidism -Her thyroid function tests indicate that she is being replaced appropriately. I advised her to continue levothyroxine 50 g by mouth every morning.  - We discussed about correct intake of levothyroxine, at fasting, with water, separated by at least 30 minutes from breakfast, and separated by more than 4 hours from calcium, iron, multivitamins, acid reflux medications (PPIs). -Patient is made aware of the fact that thyroid hormone replacement is needed for life, dose to be adjusted by periodic monitoring of thyroid function tests. - Continue LT4 50 mcg po qday for hypothyroidism. -we discussed the correct way to take her Lt4 in the AM.  2. Diabetes mellitus without complication ([place]) Her A999333 is stable  at  6% c/w controlled type 2 DM. I gave her detailed diabetes education and she agrees to go on metformin 500mg  po qday. she will adopt CHO restricted diet.   3. Polydipsia  Her most recent Sodium is stable at 138. Continue moderate fluid restriction to 1500cc/24 hours. 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 She is clinically  euvolemic and asymptomatic today.  Pt's prior 24 hour urine Aldosterone and cortisol were within normal limits.   Her BP is better , she reacted to Lisinopril, but doing well on valsartan.  - I advised patient to maintain close follow up with Talmage Coin, MD for primary care needs.  Follow up plan: Return in about 6 months (around 08/20/2016) for follow up with pre-visit labs.  Glade Lloyd, MD Phone: [phone]  Fax: [phone]   02/19/2016, 2:03 PM

## 2016-04-26 ENCOUNTER — Other Ambulatory Visit: Payer: Self-pay | Admitting: "Endocrinology

## 2016-06-18 ENCOUNTER — Other Ambulatory Visit: Payer: Self-pay | Admitting: "Endocrinology

## 2016-07-25 ENCOUNTER — Other Ambulatory Visit: Payer: Self-pay | Admitting: "Endocrinology

## 2016-08-26 ENCOUNTER — Encounter: Payer: Self-pay | Admitting: "Endocrinology

## 2016-08-26 ENCOUNTER — Ambulatory Visit (INDEPENDENT_AMBULATORY_CARE_PROVIDER_SITE_OTHER): Payer: BLUE CROSS/BLUE SHIELD | Admitting: "Endocrinology

## 2016-08-26 VITALS — BP 134/81 | HR 72 | Ht 60.0 in | Wt 136.0 lb

## 2016-08-26 DIAGNOSIS — R631 Polydipsia: Secondary | ICD-10-CM | POA: Diagnosis not present

## 2016-08-26 DIAGNOSIS — E039 Hypothyroidism, unspecified: Secondary | ICD-10-CM | POA: Diagnosis not present

## 2016-08-26 DIAGNOSIS — E119 Type 2 diabetes mellitus without complications: Secondary | ICD-10-CM | POA: Diagnosis not present

## 2016-08-26 MED ORDER — LEVOTHYROXINE SODIUM 50 MCG PO TABS: 50.0000 ug | ORAL_TABLET | Freq: Every morning | ORAL | 1 refills | Status: DC

## 2016-08-26 MED ORDER — METFORMIN HCL 500 MG PO TABS: 500.0000 mg | ORAL_TABLET | Freq: Every day | ORAL | 1 refills | Status: DC

## 2016-08-26 NOTE — Progress Notes (Signed)
Subjective:    Patient ID: Lori [name], female    DOB: [birth year], PCP Talmage Coin, MD   Past Medical History:  Diagnosis Date  . Diabetes mellitus without complication ([place])   . Hypertension   . Thyroid disease    Past Surgical History:  Procedure Laterality Date  . APPENDECTOMY    . BREAST ENHANCEMENT SURGERY    . CESAREAN SECTION    . ROTATOR CUFF REPAIR     Social History   Social History  . Marital status: Married    Spouse name: N/A  . Number of children: N/A  . Years of education: N/A   Social History Main Topics  . Smoking status: Never Smoker  . Smokeless tobacco: Never Used  . Alcohol use No  . Drug use: No  . Sexual activity: Not Asked   Other Topics Concern  . None   Social History Narrative  . None   Outpatient Encounter Prescriptions as of 08/26/2016  Medication Sig  . Liraglutide -Weight Management (SAXENDA) 18 MG/3ML SOPN Inject into the skin.  Marland Kitchen ALPRAZolam (XANAX) 0.5 MG tablet Take 0.5 mg by mouth at bedtime as needed for anxiety.  . Butalbital-APAP-Caffeine 50-300-40 MG CAPS Take 2 capsules by mouth every 6 (six) hours.  . Calcium Citrate (CITRACAL PO) Take by mouth.  . Cholecalciferol (VITAMIN D) 2000 UNITS CAPS Take by mouth.  . denosumab (PROLIA) 60 MG/ML SOLN injection Inject 60 mg into the skin every 6 (six) months. Administer in upper arm, thigh, or abdomen  . gabapentin (NEURONTIN) 300 MG capsule One tab PO qHS for a week, then BID for a week, then TID. May double weekly to a max of 3,600mg /day  . HYDROcodone-acetaminophen (NORCO) 10-325 MG per tablet Take 1 tablet by mouth every 6 (six) hours as needed.  Marland Kitchen levothyroxine (SYNTHROID, LEVOTHROID) 50 MCG tablet Take 1 tablet (50 mcg total) by mouth every morning.  . metFORMIN (GLUCOPHAGE) 500 MG tablet Take 1 tablet (500 mg total) by mouth daily after breakfast.  . Omega-3 Fatty Acids (FISH OIL) 1000 MG CAPS Take by mouth.  . rizatriptan (MAXALT) 5 MG tablet Take 5 mg by mouth  as needed for migraine. May repeat in 2 hours if needed  . tiZANidine (ZANAFLEX) 2 MG tablet Take by mouth as directed.  . valsartan (DIOVAN) 80 MG tablet Take 80 mg by mouth daily.  Marland Kitchen venlafaxine XR (EFFEXOR-XR) 150 MG 24 hr capsule Take 150 mg by mouth daily with breakfast.  . [DISCONTINUED] levothyroxine (SYNTHROID, LEVOTHROID) 50 MCG tablet TAKE 1 TABLET EVERY MORNING  . [DISCONTINUED] metFORMIN (GLUCOPHAGE) 500 MG tablet TAKE 1 TABLET TWICE A DAY   No facility-administered encounter medications on file as of 08/26/2016.    ALLERGIES: Allergies  Allergen Reactions  . Celebrex [Celecoxib]   . Lisinopril    VACCINATION STATUS:  There is no immunization history on file for this patient.  HPI  60 yr old female with medical hx significant for Osteoporosis, arthritis, depression, anxiety, PUD, GERD, chronic pain, sleep disorder, polypharmacy, chronic smoking and hyponatremia. 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  She is on fluid restriction to <1500cc/day.  She feels much better.  No new complaints. Her serum Sodium has stabilized at 137. She is now on Velnafaxine, Nuvigil, Xanax, Maxalt. She denies dry mouth now. She has steady weight .  she is on MTF for type 2 DM and Lt4  50 mcg po qday for hypothyroidism. She  has never been treated with Lithium, demeclocycline, nor Vasopressin.  Review of Systems  Constitutional: no weight gain/loss, no fatigue, no subjective hyperthermia/hypothermia Eyes: no blurry vision, no xerophthalmia ENT: no sore throat, no nodules palpated in throat, no dysphagia/odynophagia, no hoarseness Cardiovascular: no CP/SOB/palpitations/leg swelling Respiratory: no cough/SOB Gastrointestinal: no N/V/D/C Musculoskeletal: no muscle/joint aches Skin: no rashes Neurological: no tremors/numbness/tingling/dizziness Psychiatric: no depression/anxiety  Objective:    BP 134/81   Pulse 72   Ht 5' (1.524 m)    Wt 136 lb (61.7 kg)   BMI 26.56 kg/m   Wt Readings from Last 3 Encounters:  08/26/16 136 lb (61.7 kg)  02/19/16 138 lb (62.6 kg)  08/07/15 138 lb (62.6 kg)    Physical Exam  Constitutional:  in NAD Eyes: PERRLA, EOMI, no exophthalmos ENT: moist mucous membranes, no thyromegaly, no cervical lymphadenopathy Cardiovascular: RRR, No MRG Respiratory: CTA B Gastrointestinal: abdomen soft, NT, ND, BS+ Musculoskeletal: no deformities, strength intact in all 4 Skin: moist, warm, no rashes Neurological: no tremor with outstretched hands, DTR normal in all 4  Labs from 02/13/2016 showed A1c is 6%, free T4 1.02, TSH 0.85, sodium 138, BUN 13, creatinine 0.78 Labs from 08/01/15: Free t4 1.19, TSH 1.94, a1c , 5.9, Na 138.  Labs from 08/19/2016 showed sodium 137, A1c 5.9%, TSH 0.54, ft4 1.03.  Assessment & Plan:   1. Primary hypothyroidism -Her thyroid function tests indicate that she is being replaced appropriately. I advised her to continue levothyroxine 50 g by mouth every morning.  - We discussed about correct intake of levothyroxine, at fasting, with water, separated by at least 30 minutes from breakfast, and separated by more than 4 hours from calcium, iron, multivitamins, acid reflux medications (PPIs). -Patient is made aware of the fact that thyroid hormone replacement is needed for life, dose to be adjusted by periodic monitoring of thyroid function tests. - Continue LT4 50 mcg po qday for hypothyroidism. -we discussed the correct way to take her Lt4 in the AM.  2. Diabetes mellitus without complication ([place]) Her [id number] is stable  at  5.9% c/w controlled type 2 DM. I gave her detailed diabetes education and she agrees to go on metformin 500mg  po qday. she will adopt CHO restricted diet.   3. Polydipsia/Hyponatremia  Her most recent Sodium is stable at 137. She is advised to continue moderate fluid restriction to 1500cc/24 hours. 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 She is clinically euvolemic and asymptomatic today.  Pt's prior 24 hour urine Aldosterone and cortisol were within normal limits.   Her BP is better , she reacted to Lisinopril, but doing well on valsartan.  - I advised patient to maintain close follow up with Talmage Coin, MD for primary care needs.  Follow up plan: Return in about 6 months (around 02/24/2017) for follow up with pre-visit labs.  Glade Lloyd, MD Phone: [phone]  Fax: [phone]   08/26/2016, 1:50 PM

## 2016-10-23 ENCOUNTER — Other Ambulatory Visit: Payer: Self-pay | Admitting: "Endocrinology

## 2016-12-15 ENCOUNTER — Other Ambulatory Visit: Payer: Self-pay | Admitting: "Endocrinology

## 2016-12-25 ENCOUNTER — Encounter (INDEPENDENT_AMBULATORY_CARE_PROVIDER_SITE_OTHER): Payer: Self-pay | Admitting: Internal Medicine

## 2016-12-30 ENCOUNTER — Encounter (INDEPENDENT_AMBULATORY_CARE_PROVIDER_SITE_OTHER): Payer: Self-pay | Admitting: Internal Medicine

## 2016-12-30 ENCOUNTER — Ambulatory Visit (INDEPENDENT_AMBULATORY_CARE_PROVIDER_SITE_OTHER): Payer: BLUE CROSS/BLUE SHIELD | Admitting: Internal Medicine

## 2016-12-30 ENCOUNTER — Encounter (INDEPENDENT_AMBULATORY_CARE_PROVIDER_SITE_OTHER): Payer: Self-pay

## 2016-12-30 VITALS — BP 140/80 | HR 76 | Temp 97.8°F | Ht 60.0 in | Wt 136.8 lb

## 2016-12-30 DIAGNOSIS — D508 Other iron deficiency anemias: Secondary | ICD-10-CM

## 2016-12-30 NOTE — Patient Instructions (Addendum)
CBC. Iron, Ferritin, Vitamin B12.

## 2016-12-30 NOTE — Progress Notes (Signed)
Subjective:    Patient ID: Lori [name], female    DOB: 07/27/1956, 61 y.o.   MRN: [id number]  HPI Referred by Dr. Trudi Ida for IDA. She says she had to take B12 shots years ago. She denies prior hx of anemia.  She is presently taking Iron. Her appetite is good. She is trying to lose weight. There is no abdominal pain. She has a BM daily.  No family hx of colon cancer. She does take [employer] about a couple every month.     Her last colonoscopy was in 2010 which was normal (Screening). Dr. West Carbo.  12/17/2006 EGD: Had been taking Goody Powders 3-4 a day and under a lot of stress) Giant ulcer duodenal bulb. Erosions body and antrum of stomach. Clotest negative.    12/09/2016 H and H 10.6 and 34.7.  MCV 88.3. 11/18/2016 H and H 11.8 and 38.2 12/09/2016 Ferritin 5.4. Iron 46    09/16/2016 Iron 73    10/31/2016 Occult blood negative. 10/29/2016 Occult blood negative. 09/19/2016 Occult blood negative  Review of Systems Past Medical History:  Diagnosis Date  . Diabetes mellitus without complication ([place])   . Hypertension   . Thyroid disease     Past Surgical History:  Procedure Laterality Date  . APPENDECTOMY    . BREAST ENHANCEMENT SURGERY    . CESAREAN SECTION    . eye lid surgery    . ROTATOR CUFF REPAIR      Allergies  Allergen Reactions  . Celebrex [Celecoxib]   . Lisinopril     Current Outpatient Prescriptions on File Prior to Visit  Medication Sig Dispense Refill  . Butalbital-APAP-Caffeine 50-300-40 MG CAPS Take 2 capsules by mouth every 6 (six) hours.    . Calcium Citrate (CITRACAL PO) Take by mouth.    . gabapentin (NEURONTIN) 300 MG capsule One tab PO qHS for a week, then BID for a week, then TID. May double weekly to a max of 3,600mg /day 180 capsule 3  . HYDROcodone-acetaminophen (NORCO) 10-325 MG per tablet Take 1 tablet by mouth every 6 (six) hours as needed.    Marland Kitchen levothyroxine (SYNTHROID, LEVOTHROID) 50 MCG tablet Take 1 tablet (50 mcg total)  by mouth every morning. 90 tablet 1  . Liraglutide -Weight Management (SAXENDA) 18 MG/3ML SOPN Inject into the skin.    . metFORMIN (GLUCOPHAGE) 500 MG tablet Take 1 tablet (500 mg total) by mouth daily after breakfast. 90 tablet 1  . rizatriptan (MAXALT) 5 MG tablet Take 5 mg by mouth as needed for migraine. May repeat in 2 hours if needed    . tiZANidine (ZANAFLEX) 2 MG tablet Take by mouth as directed.    . valsartan (DIOVAN) 80 MG tablet Take 80 mg by mouth daily.    Marland Kitchen venlafaxine XR (EFFEXOR-XR) 150 MG 24 hr capsule Take 150 mg by mouth daily with breakfast.    . ALPRAZolam (XANAX) 0.5 MG tablet Take 0.5 mg by mouth at bedtime as needed for anxiety.    . Cholecalciferol (VITAMIN D) 2000 UNITS CAPS Take by mouth.    . denosumab (PROLIA) 60 MG/ML SOLN injection Inject 60 mg into the skin every 6 (six) months. Administer in upper arm, thigh, or abdomen    . levothyroxine (SYNTHROID, LEVOTHROID) 50 MCG tablet TAKE 1 TABLET EVERY MORNING 90 tablet 1   No current facility-administered medications on file prior to visit.        Objective:   Physical Exam Blood pressure 140/80, pulse 76, temperature  97.8 F (36.6 C), height 5' (1.524 m), weight 136 lb 12.8 oz (62.1 kg). Alert and oriented. Skin warm and dry. Oral mucosa is moist.   . Sclera anicteric, conjunctivae is pink. Thyroid not enlarged. No cervical lymphadenopathy. Lungs clear. Heart regular rate and rhythm.  Abdomen is soft. Bowel sounds are positive. No hepatomegaly. No abdominal masses felt. No tenderness.  No edema to lower extremities.  Stool brown and guaiac negative.         Assessment & Plan:  IDA. CBC, Iron, Ferritin. Vitamin B12. May need colonoscopy and EGD once I get lab back.  Will discuss with Dr. Laural Golden

## 2016-12-31 LAB — CBC WITH DIFFERENTIAL/PLATELET
BASOS ABS: 63 {cells}/uL (ref 0–200)
Basophils Relative: 1 %
Eosinophils Absolute: 126 cells/uL (ref 15–500)
Eosinophils Relative: 2 %
HCT: 35.2 % (ref 35.0–45.0)
Hemoglobin: 11.2 g/dL — ABNORMAL LOW (ref 11.7–15.5)
LYMPHS PCT: 45 %
Lymphs Abs: 2835 cells/uL (ref 850–3900)
MCH: 26.4 pg — AB (ref 27.0–33.0)
MCHC: 31.8 g/dL — AB (ref 32.0–36.0)
MCV: 82.8 fL (ref 80.0–100.0)
MONOS PCT: 10 %
MPV: 9.3 fL (ref 7.5–12.5)
Monocytes Absolute: 630 cells/uL (ref 200–950)
NEUTROS ABS: 2646 {cells}/uL (ref 1500–7800)
Neutrophils Relative %: 42 %
PLATELETS: 288 10*3/uL (ref 140–400)
RBC: 4.25 MIL/uL (ref 3.80–5.10)
RDW: 16.3 % — AB (ref 11.0–15.0)
WBC: 6.3 10*3/uL (ref 3.8–10.8)

## 2016-12-31 LAB — FERRITIN: Ferritin: 16 ng/mL — ABNORMAL LOW (ref 20–288)

## 2016-12-31 LAB — IRON: IRON: 231 ug/dL — AB (ref 45–160)

## 2016-12-31 LAB — VITAMIN B12: Vitamin B-12: 541 pg/mL (ref 200–1100)

## 2017-01-01 ENCOUNTER — Encounter (INDEPENDENT_AMBULATORY_CARE_PROVIDER_SITE_OTHER): Payer: Self-pay

## 2017-01-01 ENCOUNTER — Encounter (INDEPENDENT_AMBULATORY_CARE_PROVIDER_SITE_OTHER): Payer: Self-pay | Admitting: Internal Medicine

## 2017-01-01 ENCOUNTER — Other Ambulatory Visit (INDEPENDENT_AMBULATORY_CARE_PROVIDER_SITE_OTHER): Payer: Self-pay | Admitting: Internal Medicine

## 2017-01-01 ENCOUNTER — Telehealth (INDEPENDENT_AMBULATORY_CARE_PROVIDER_SITE_OTHER): Payer: Self-pay | Admitting: Internal Medicine

## 2017-01-01 DIAGNOSIS — D508 Other iron deficiency anemias: Secondary | ICD-10-CM

## 2017-01-01 NOTE — Telephone Encounter (Signed)
Ann, EGD 

## 2017-01-02 ENCOUNTER — Encounter (INDEPENDENT_AMBULATORY_CARE_PROVIDER_SITE_OTHER): Payer: Self-pay | Admitting: *Deleted

## 2017-01-02 DIAGNOSIS — D649 Anemia, unspecified: Secondary | ICD-10-CM | POA: Insufficient documentation

## 2017-01-02 NOTE — Telephone Encounter (Signed)
EGD sch'd 01/27/17, patient aware, instructions mailed

## 2017-01-21 ENCOUNTER — Other Ambulatory Visit: Payer: Self-pay | Admitting: "Endocrinology

## 2017-01-27 ENCOUNTER — Encounter (INDEPENDENT_AMBULATORY_CARE_PROVIDER_SITE_OTHER): Payer: Self-pay | Admitting: *Deleted

## 2017-02-10 ENCOUNTER — Encounter (HOSPITAL_COMMUNITY)
Admission: RE | Disposition: A | Discharge status: Home / Self Care | Payer: Self-pay | Source: Ambulatory Visit | Attending: Internal Medicine

## 2017-02-10 ENCOUNTER — Encounter (HOSPITAL_COMMUNITY): Payer: Self-pay | Admitting: *Deleted

## 2017-02-10 ENCOUNTER — Ambulatory Visit (HOSPITAL_COMMUNITY)
Admission: RE | Admit: 2017-02-10 | Discharge: 2017-02-10 | Disposition: A | Discharge status: Home / Self Care | Payer: BLUE CROSS/BLUE SHIELD | Source: Ambulatory Visit | Attending: Internal Medicine | Admitting: Internal Medicine

## 2017-02-10 DIAGNOSIS — K449 Diaphragmatic hernia without obstruction or gangrene: Secondary | ICD-10-CM

## 2017-02-10 DIAGNOSIS — E119 Type 2 diabetes mellitus without complications: Secondary | ICD-10-CM | POA: Insufficient documentation

## 2017-02-10 DIAGNOSIS — Z87891 Personal history of nicotine dependence: Secondary | ICD-10-CM | POA: Insufficient documentation

## 2017-02-10 DIAGNOSIS — J988 Other specified respiratory disorders: Secondary | ICD-10-CM | POA: Diagnosis not present

## 2017-02-10 DIAGNOSIS — I10 Essential (primary) hypertension: Secondary | ICD-10-CM | POA: Diagnosis not present

## 2017-02-10 DIAGNOSIS — D649 Anemia, unspecified: Secondary | ICD-10-CM | POA: Insufficient documentation

## 2017-02-10 DIAGNOSIS — Z8711 Personal history of peptic ulcer disease: Secondary | ICD-10-CM | POA: Diagnosis not present

## 2017-02-10 DIAGNOSIS — K2950 Unspecified chronic gastritis without bleeding: Secondary | ICD-10-CM | POA: Insufficient documentation

## 2017-02-10 DIAGNOSIS — F419 Anxiety disorder, unspecified: Secondary | ICD-10-CM | POA: Diagnosis not present

## 2017-02-10 DIAGNOSIS — D500 Iron deficiency anemia secondary to blood loss (chronic): Secondary | ICD-10-CM | POA: Insufficient documentation

## 2017-02-10 DIAGNOSIS — F329 Major depressive disorder, single episode, unspecified: Secondary | ICD-10-CM | POA: Insufficient documentation

## 2017-02-10 DIAGNOSIS — Z888 Allergy status to other drugs, medicaments and biological substances status: Secondary | ICD-10-CM | POA: Insufficient documentation

## 2017-02-10 DIAGNOSIS — K257 Chronic gastric ulcer without hemorrhage or perforation: Secondary | ICD-10-CM | POA: Insufficient documentation

## 2017-02-10 DIAGNOSIS — Z85828 Personal history of other malignant neoplasm of skin: Secondary | ICD-10-CM | POA: Insufficient documentation

## 2017-02-10 DIAGNOSIS — E039 Hypothyroidism, unspecified: Secondary | ICD-10-CM | POA: Diagnosis not present

## 2017-02-10 DIAGNOSIS — Z886 Allergy status to analgesic agent status: Secondary | ICD-10-CM | POA: Diagnosis not present

## 2017-02-10 DIAGNOSIS — D508 Other iron deficiency anemias: Secondary | ICD-10-CM

## 2017-02-10 DIAGNOSIS — Z7982 Long term (current) use of aspirin: Secondary | ICD-10-CM | POA: Insufficient documentation

## 2017-02-10 DIAGNOSIS — K3189 Other diseases of stomach and duodenum: Secondary | ICD-10-CM | POA: Diagnosis not present

## 2017-02-10 DIAGNOSIS — Z7984 Long term (current) use of oral hypoglycemic drugs: Secondary | ICD-10-CM | POA: Diagnosis not present

## 2017-02-10 DIAGNOSIS — K259 Gastric ulcer, unspecified as acute or chronic, without hemorrhage or perforation: Secondary | ICD-10-CM | POA: Diagnosis not present

## 2017-02-10 DIAGNOSIS — K269 Duodenal ulcer, unspecified as acute or chronic, without hemorrhage or perforation: Secondary | ICD-10-CM | POA: Diagnosis not present

## 2017-02-10 DIAGNOSIS — Z79899 Other long term (current) drug therapy: Secondary | ICD-10-CM | POA: Diagnosis not present

## 2017-02-10 DIAGNOSIS — K2970 Gastritis, unspecified, without bleeding: Secondary | ICD-10-CM | POA: Diagnosis not present

## 2017-02-10 DIAGNOSIS — Z7983 Long term (current) use of bisphosphonates: Secondary | ICD-10-CM | POA: Diagnosis not present

## 2017-02-10 HISTORY — DX: Anemia, unspecified: D64.9

## 2017-02-10 HISTORY — DX: Hypothyroidism, unspecified: E03.9

## 2017-02-10 HISTORY — PX: BIOPSY: SHX5522

## 2017-02-10 HISTORY — DX: Major depressive disorder, single episode, unspecified: F32.9

## 2017-02-10 HISTORY — DX: Anxiety disorder, unspecified: F41.9

## 2017-02-10 HISTORY — DX: Sleep apnea, unspecified: G47.30

## 2017-02-10 HISTORY — DX: Malignant (primary) neoplasm, unspecified: C80.1

## 2017-02-10 HISTORY — DX: Depression, unspecified: F32.A

## 2017-02-10 HISTORY — PX: ESOPHAGOGASTRODUODENOSCOPY: SHX5428

## 2017-02-10 LAB — KOH PREP: KOH PREP: NONE SEEN

## 2017-02-10 LAB — GLUCOSE, CAPILLARY
GLUCOSE-CAPILLARY: 79 mg/dL (ref 65–99)
Glucose-Capillary: 88 mg/dL (ref 65–99)

## 2017-02-10 SURGERY — EGD (ESOPHAGOGASTRODUODENOSCOPY)
Anesthesia: Moderate Sedation

## 2017-02-10 MED ORDER — NALOXONE HCL 0.4 MG/ML IJ SOLN
INTRAMUSCULAR | Status: DC | PRN
  Administered 2017-02-10: 0.4 mg via INTRAVENOUS

## 2017-02-10 MED ORDER — MIDAZOLAM HCL 5 MG/5ML IJ SOLN
INTRAMUSCULAR | Status: DC | PRN
  Administered 2017-02-10 (×2): 2 mg via INTRAVENOUS

## 2017-02-10 MED ORDER — STERILE WATER FOR IRRIGATION IR SOLN
Status: DC | PRN
  Administered 2017-02-10: 09:00:00

## 2017-02-10 MED ORDER — LIDOCAINE VISCOUS 2 % MT SOLN
OROMUCOSAL | Status: DC | PRN
  Administered 2017-02-10: 3 mL via OROMUCOSAL

## 2017-02-10 MED ORDER — MIDAZOLAM HCL 5 MG/5ML IJ SOLN
INTRAMUSCULAR | Status: AC
  Filled 2017-02-10: qty 10

## 2017-02-10 MED ORDER — FLUMAZENIL 0.5 MG/5ML IV SOLN
INTRAVENOUS | Status: DC | PRN
  Administered 2017-02-10: 0.2 mg via INTRAVENOUS

## 2017-02-10 MED ORDER — MEPERIDINE HCL 50 MG/ML IJ SOLN
INTRAMUSCULAR | Status: AC
  Filled 2017-02-10: qty 1

## 2017-02-10 MED ORDER — SODIUM CHLORIDE 0.9 % IV SOLN
INTRAVENOUS | Status: DC
  Administered 2017-02-10: 08:00:00 via INTRAVENOUS

## 2017-02-10 MED ORDER — LIDOCAINE VISCOUS 2 % MT SOLN
OROMUCOSAL | Status: AC
  Filled 2017-02-10: qty 15

## 2017-02-10 MED ORDER — PANTOPRAZOLE SODIUM 40 MG PO TBEC: 40.0000 mg | DELAYED_RELEASE_TABLET | Freq: Two times a day (BID) | ORAL | 2 refills | Status: DC

## 2017-02-10 MED ORDER — MEPERIDINE HCL 50 MG/ML IJ SOLN
INTRAMUSCULAR | Status: DC | PRN
  Administered 2017-02-10 (×2): 25 mg via INTRAVENOUS

## 2017-02-10 NOTE — H&P (Signed)
Lori [name] is an 61 y.o. female.   Chief Complaint: Patient is here for EGD. HPI: Patient is 61 year old Caucasian female who has history of peptic ulcer disease secondary to NSAID therapy who was recently found to have iron deficiency anemia. She has not experienced epigastric pain melena or rectal bleeding nausea vomiting or heartburn. She takes Fiorinal she takes [place] powder once or twice a week. She has not taken ibuprofen in 7 weeks. She has good appetite. She was seen in the office her stool was guaiac negative. She has responded to by mouth iron. Hemoglobin 2 months ago was 10.6 and 6 weeks ago was up to 11.2. Last colonoscopy was normal in 2010.  Past Medical History:  Diagnosis Date  . Anemia   . Anxiety   . Cancer ([place])    skin  . Depression   . Diabetes mellitus without complication ([place])   . Hypertension   . Hypothyroidism   . Sleep apnea   . Thyroid disease     Past Surgical History:  Procedure Laterality Date  . APPENDECTOMY    . BACK SURGERY     L4, L5  . BREAST ENHANCEMENT SURGERY    . CESAREAN SECTION    . eye lid surgery    . ROTATOR CUFF REPAIR      Family History  Problem Relation Age of Onset  . Hypertension Father   . Cancer Sister   . Colon cancer Neg Hx    Social History:  reports that she quit smoking about 4 years ago. She has never used smokeless tobacco. She reports that she does not drink alcohol or use drugs.  Allergies:  Allergies  Allergen Reactions  . Lisinopril Anaphylaxis    hypotension  . Celebrex [Celecoxib]     Stomach cramps     Medications Prior to Admission  Medication Sig Dispense Refill  . ALPRAZolam (XANAX) 0.5 MG tablet Take 0.5 mg by mouth at bedtime as needed for anxiety.    . Aspirin-Acetaminophen-Caffeine (GOODY HEADACHE PO) Take 1 packet by mouth daily as needed (pain).    . butalbital-aspirin-caffeine-codeine (FIORINAL WITH CODEINE) 50-325-40-30 MG capsule Take 1 capsule by mouth 3 (three) times daily as  needed for pain or headache.    . Calcium Citrate (CITRACAL PO) Take 1 tablet by mouth once a week.     Mariane Baumgarten Calcium (STOOL SOFTENER PO) Take 2-3 tablets by mouth at bedtime.    . FeFum-FePo-FA-B Cmp-C-Zn-Mn-Cu (TANDEM PLUS) 162-115.2-1 MG CAPS Take 1 capsule by mouth daily.     Marland Kitchen gabapentin (NEURONTIN) 300 MG capsule One tab PO qHS for a week, then BID for a week, then TID. May double weekly to a max of 3,600mg /day (Patient taking differently: Take 300 mg by mouth 3 (three) times daily. ) 180 capsule 3  . HYDROcodone-acetaminophen (NORCO) 10-325 MG per tablet Take 1 tablet by mouth every 6 (six) hours as needed for moderate pain.     Marland Kitchen levothyroxine (SYNTHROID, LEVOTHROID) 50 MCG tablet TAKE 1 TABLET EVERY MORNING 90 tablet 1  . Liraglutide -Weight Management (SAXENDA) 18 MG/3ML SOPN Inject 3 mg into the skin daily.     . metFORMIN (GLUCOPHAGE) 500 MG tablet TAKE 1 TABLET TWICE A DAY (Patient taking differently: Take 500mg s by mouth daily) 180 tablet 0  . Polyvinyl Alcohol-Povidone (REFRESH OP) Apply 1 drop to eye daily.    . rizatriptan (MAXALT-MLT) 5 MG disintegrating tablet Take 5 mg by mouth as needed for migraine. May repeat in 2  hours if needed    . tiZANidine (ZANAFLEX) 2 MG tablet Take 2 mg by mouth daily as needed for muscle spasms.     . valsartan (DIOVAN) 80 MG tablet Take 80 mg by mouth at bedtime.     Marland Kitchen venlafaxine XR (EFFEXOR-XR) 150 MG 24 hr capsule Take 150 mg by mouth at bedtime.     . Vitamin D, Ergocalciferol, (DRISDOL) 50000 units CAPS capsule Take 50,000 Units by mouth every Monday.    . denosumab (PROLIA) 60 MG/ML SOLN injection Inject 60 mg into the skin every 6 (six) months. Administer in upper arm, thigh, or abdomen    . ibuprofen (ADVIL,MOTRIN) 800 MG tablet Take 800 mg by mouth daily as needed for pain.      Results for orders placed or performed during the hospital encounter of 02/10/17 (from the past 48 hour(s))  Glucose, capillary     Status: None    Collection Time: 02/10/17  8:32 AM  Result Value Ref Range   Glucose-Capillary 79 65 - 99 mg/dL   No results found.  ROS  Blood pressure (!) 153/100, pulse 70, temperature 97.8 F (36.6 C), temperature source Oral, resp. rate 17, height 5' (1.524 m), weight 137 lb (62.1 kg), SpO2 98 %. Physical Exam  Constitutional: She appears well-developed and well-nourished.  HENT:  Mouth/Throat: Oropharynx is clear and moist.  Eyes: Conjunctivae are normal. No scleral icterus.  Neck: No thyromegaly present.  Cardiovascular: Normal rate, regular rhythm and normal heart sounds.   No murmur heard. Respiratory: Effort normal and breath sounds normal.  GI: Soft. She exhibits no distension and no mass. There is no tenderness.  Musculoskeletal: She exhibits no edema.  Lymphadenopathy:    She has no cervical adenopathy.  Neurological: She is alert.  Skin: Skin is warm and dry.     Assessment/Plan Iron deficiency anemia. History of peptic ulcer disease Diagnostic EGD.   Hildred Laser, MD 02/10/2017, 9:02 AM

## 2017-02-10 NOTE — Op Note (Signed)
[facility] Patient Name: Lori [name] Procedure Date: 02/10/2017 8:30 AM MRN: [id number] Date of Birth: March 24, 1956 Attending MD: Hildred Laser , MD CSN: [id number] Age: 60 Admit Type: Outpatient Procedure:                Upper GI endoscopy Indications:              Iron deficiency anemia secondary to chronic blood                            loss Providers:                Hildred Laser, MD, [place]is [place] B. Sharon Seller, RN, Aram Candela Referring MD:             Rachel Bo. Harris, MD Medicines:                Lidocaine spray, Meperidine 50 mg IV, Midazolam 4                            mg IV, Flumazenil 0.2 mg IV, Naloxone 0.4 mg IV Complications:            patient developed airway obstruction and rest.                            Depression. She was treated with jaw lift,oral                            airway, and non-rebreathing mask. She was also                            given flumazenil and Naloxane.                           respiratory depression and upper airway obstruction Estimated Blood Loss:     Estimated blood loss: none.                           Estimated blood loss was minimal. Procedure:                Pre-Anesthesia Assessment:                           - Prior to the procedure, a History and Physical                            was performed, and patient medications and                            allergies were reviewed. The patient's tolerance of                            previous anesthesia was also reviewed. The risks  and benefits of the procedure and the sedation                            options and risks were discussed with the patient.                            All questions were answered, and informed consent                            was obtained. Prior Anticoagulants: The patient                            last took aspirin 1 day prior to the procedure. ASA                            Grade Assessment:  III - A patient with severe                            systemic disease. After reviewing the risks and                            benefits, the patient was deemed in satisfactory                            condition to undergo the procedure.                           After obtaining informed consent, the endoscope was                            passed under direct vision. Throughout the                            procedure, the patient's blood pressure, pulse, and                            oxygen saturations were monitored continuously. The                            [id number] ([id number]) scope was introduced through the                            mouth, and advanced to the second part of duodenum.                            The upper GI endoscopy was accomplished without                            difficulty. The patient tolerated the procedure                            well. Scope In: 9:21:05 AM Scope Out: 9:30:31 AM Total Procedure Duration: 0 hours 9 minutes 26 seconds  Findings:  Mucosa coated with thick layer ofwhitish material felt to be food       debris. Brushing taken for KOH prep.      The exam of the esophagus was otherwise normal.      The Z-line was regular and was found 34 cm from the incisors.      A 4 cm hiatal hernia was present.      Patchy mild inflammation characterized by erosions, erythema and       friability was found in the gastric antrum. Biopsies were taken with a       cold forceps for histology.      A healed ulcer was found in the gastric antrum.      Three non-bleeding superficial gastric ulcers with no stigmata of       bleeding were found in the prepyloric region of the stomach. The largest       lesion was 4 mm in largest dimension.      The exam of the stomach was otherwise normal.      One non-bleeding cratered duodenal ulcer with no stigmata of bleeding       was found in the duodenal bulb. The lesion was 6 mm in largest dimension.      The  second portion of the duodenum was normal. Impression:               - Z-line regular, 34 cm from the incisors.                           - 4 cm hiatal hernia.                           - Gastritis. Biopsied.                           - Scar in the gastric antrum.                           - Non-bleeding gastric ulcers with no stigmata of                            bleeding.                           - One non-bleeding duodenal ulcer with no stigmata                            of bleeding.                           - Normal second portion of the duodenum. Moderate Sedation:      Moderate (conscious) sedation was administered by the endoscopy nurse       and supervised by the endoscopist. The following parameters were       monitored: oxygen saturation, heart rate, blood pressure, CO2       capnography and response to care. Total physician intraservice time was       22 minutes. Recommendation:           - Patient has a contact number available for  emergencies. The signs and symptoms of potential                            delayed complications were discussed with the                            patient. Return to normal activities tomorrow.                            Written discharge instructions were provided to the                            patient.                           - Continue present medications.                           - Use Protonix (pantoprazole) 40 mg PO BID today.                           - Await pathology results.                           - Patient advised to keep NSAID use to minimum.                           - Return to GI clinic in 2 months.                           - Resume previous diet today. Procedure Code(s):        --- Professional ---                           [phone], Esophagogastroduodenoscopy, flexible,                            transoral; with biopsy, single or multiple                           99152, Moderate sedation  services provided by the                            same physician or other qualified health care                            professional performing the diagnostic or                            therapeutic service that the sedation supports,                            requiring the presence of an independent trained                            observer to  assist in the monitoring of the                            patient's level of consciousness and physiological                            status; initial 15 minutes of intraservice time,                            patient age 58 years or older Diagnosis Code(s):        --- Professional ---                           K44.9, Diaphragmatic hernia without obstruction or                            gangrene                           K29.70, Gastritis, unspecified, without bleeding                           K31.89, Other diseases of stomach and duodenum                           K25.9, Gastric ulcer, unspecified as acute or                            chronic, without hemorrhage or perforation                           K26.9, Duodenal ulcer, unspecified as acute or                            chronic, without hemorrhage or perforation                           D50.0, Iron deficiency anemia secondary to blood                            loss (chronic) CPT copyright 2016 American Medical Association. All rights reserved. The codes documented in this report are preliminary and upon coder review may  be revised to meet current compliance requirements. Hildred Laser, MD Hildred Laser, MD 02/10/2017 9:51:12 AM This report has been signed electronically. Number of Addenda: 0

## 2017-02-10 NOTE — Discharge Instructions (Signed)
° ° °  Esophagogastroduodenoscopy, Care After Refer to this sheet in the next few weeks. These instructions provide you with information about caring for yourself after your procedure. Your health care provider may also give you more specific instructions. Your treatment has been planned according to current medical practices, but problems sometimes occur. Call your health care provider if you have any problems or questions after your procedure. What can I expect after the procedure? After the procedure, it is common to have:  A sore throat.  Nausea.  Bloating.  Dizziness.  Fatigue.  Follow these instructions at home:  Do not eat or drink anything until the numbing medicine (local anesthetic) has worn off and your gag reflex has returned. You will know that the local anesthetic has worn off when you can swallow comfortably.  Do not drive for 24 hours if you received a medicine to help you relax (sedative).  If your health care provider took a tissue sample for testing during the procedure, make sure to get your test results. This is your responsibility. Ask your health care provider or the department performing the test when your results will be ready.  Keep all follow-up visits as told by your health care provider. This is important. Contact a health care provider if:  You cannot stop coughing.  You are not urinating.  You are urinating less than usual. Get help right away if:  You have trouble swallowing.  You cannot eat or drink.  You have throat or chest pain that gets worse.  You are dizzy or light-headed.  You faint.  You have nausea or vomiting.  You have chills.  You have a fever.  You have severe abdominal pain.  You have black, tarry, or bloody stools. This information is not intended to replace advice given to you by your health care provider. Make sure you discuss any questions you have with your health care provider. Document Released: 08/12/2012  Document Revised: 02/01/2016 Document Reviewed: 07/20/2015 Elsevier Interactive Patient Education  2018 [employer]. Keep Fiorinal use to minimum. Do not take other NSAIDs while on Fiorinal. Resume other medications and diet as before. Pantoprazole 40 mg by mouth 30 minutes before breakfast and evening meal daily. No driving for 24 hours. Physician will call with biopsy results. Office visit in 2 months.

## 2017-02-17 ENCOUNTER — Encounter (HOSPITAL_COMMUNITY): Payer: Self-pay | Admitting: Internal Medicine

## 2017-02-24 ENCOUNTER — Ambulatory Visit: Payer: BLUE CROSS/BLUE SHIELD | Admitting: "Endocrinology

## 2017-03-31 LAB — TSH: TSH: 1.06 (ref <=5.90)

## 2017-03-31 LAB — BASIC METABOLIC PANEL: Sodium: 138 (ref 137–147)

## 2017-04-07 ENCOUNTER — Encounter: Payer: Self-pay | Admitting: "Endocrinology

## 2017-04-07 ENCOUNTER — Ambulatory Visit: Payer: BLUE CROSS/BLUE SHIELD | Admitting: "Endocrinology

## 2017-04-07 ENCOUNTER — Ambulatory Visit (INDEPENDENT_AMBULATORY_CARE_PROVIDER_SITE_OTHER): Payer: BLUE CROSS/BLUE SHIELD | Admitting: "Endocrinology

## 2017-04-07 VITALS — BP 137/99 | HR 75 | Ht 60.0 in | Wt 135.2 lb

## 2017-04-07 DIAGNOSIS — R631 Polydipsia: Secondary | ICD-10-CM

## 2017-04-07 DIAGNOSIS — E119 Type 2 diabetes mellitus without complications: Secondary | ICD-10-CM

## 2017-04-07 DIAGNOSIS — E039 Hypothyroidism, unspecified: Secondary | ICD-10-CM

## 2017-04-07 MED ORDER — LEVOTHYROXINE SODIUM 50 MCG PO TABS: 50.0000 ug | ORAL_TABLET | Freq: Every morning | ORAL | 2 refills | Status: DC

## 2017-04-07 MED ORDER — METFORMIN HCL 500 MG PO TABS: 500.0000 mg | ORAL_TABLET | Freq: Every day | ORAL | 2 refills | Status: DC

## 2017-04-07 NOTE — Progress Notes (Signed)
Subjective:    Patient ID: Lori [name], female    DOB: [birth year],   Past Medical History:  Diagnosis Date  . Anemia   . Anxiety   . Cancer ([place])    skin  . Depression   . Diabetes mellitus without complication ([place])   . Hypertension   . Hypothyroidism   . Sleep apnea   . Thyroid disease    Past Surgical History:  Procedure Laterality Date  . APPENDECTOMY    . BACK SURGERY     L4, L5  . BIOPSY  02/10/2017   Procedure: BIOPSY;  Surgeon: Rogene Houston, MD;  [facility];  Service: Endoscopy;;  gastric   . BREAST ENHANCEMENT SURGERY    . CESAREAN SECTION    . ESOPHAGOGASTRODUODENOSCOPY N/A 02/10/2017   Procedure: ESOPHAGOGASTRODUODENOSCOPY (EGD);  Surgeon: Rogene Houston, MD;  Location[facility];  Service: Endoscopy;  Laterality: N/A;  . eye lid surgery    . ROTATOR CUFF REPAIR     Social History   Social History  . Marital status: Married    Spouse name: N/A  . Number of children: N/A  . Years of education: N/A   Social History Main Topics  . Smoking status: Former Smoker    Quit date: 09/12/2012  . Smokeless tobacco: Never Used     Comment: Chews nicorette gum  . Alcohol use No  . Drug use: No  . Sexual activity: No   Other Topics Concern  . None   Social History Narrative  . None   Outpatient Encounter Prescriptions as of 04/07/2017  Medication Sig  . ALPRAZolam (XANAX) 0.5 MG tablet Take 0.5 mg by mouth at bedtime as needed for anxiety.  . butalbital-aspirin-caffeine-codeine (FIORINAL WITH CODEINE) 50-325-40-30 MG capsule Take 1 capsule by mouth 3 (three) times daily as needed for pain or headache.  . Calcium Citrate (CITRACAL PO) Take 1 tablet by mouth once a week.   . denosumab (PROLIA) 60 MG/ML SOLN injection Inject 60 mg into the skin every 6 (six) months. Administer in upper arm, thigh, or abdomen  . Docusate Calcium (STOOL SOFTENER PO) Take 2-3 tablets by mouth at bedtime.  . FeFum-FePo-FA-B Cmp-C-Zn-Mn-Cu (TANDEM PLUS)  162-115.2-1 MG CAPS Take 1 capsule by mouth daily.   Marland Kitchen gabapentin (NEURONTIN) 300 MG capsule One tab PO qHS for a week, then BID for a week, then TID. May double weekly to a max of 3,600mg /day (Patient taking differently: Take 300 mg by mouth 3 (three) times daily. )  . HYDROcodone-acetaminophen (NORCO) 10-325 MG per tablet Take 1 tablet by mouth every 6 (six) hours as needed for moderate pain.   Marland Kitchen levothyroxine (SYNTHROID, LEVOTHROID) 50 MCG tablet Take 1 tablet (50 mcg total) by mouth every morning.  . Liraglutide -Weight Management (SAXENDA) 18 MG/3ML SOPN Inject 3 mg into the skin daily.   Marland Kitchen losartan (COZAAR) 100 MG tablet Take 100 mg by mouth daily.  . metFORMIN (GLUCOPHAGE) 500 MG tablet Take 1 tablet (500 mg total) by mouth daily with breakfast.  . pantoprazole (PROTONIX) 40 MG tablet Take 1 tablet (40 mg total) by mouth 2 (two) times daily before a meal.  . Polyvinyl Alcohol-Povidone (REFRESH OP) Apply 1 drop to eye daily.  . rizatriptan (MAXALT-MLT) 5 MG disintegrating tablet Take 5 mg by mouth as needed for migraine. May repeat in 2 hours if needed  . tiZANidine (ZANAFLEX) 2 MG tablet Take 2 mg by mouth daily as needed for muscle spasms.   Marland Kitchen  venlafaxine XR (EFFEXOR-XR) 150 MG 24 hr capsule Take 150 mg by mouth at bedtime.   . Vitamin D, Ergocalciferol, (DRISDOL) 50000 units CAPS capsule Take 50,000 Units by mouth every Monday.  . [DISCONTINUED] levothyroxine (SYNTHROID, LEVOTHROID) 50 MCG tablet TAKE 1 TABLET EVERY MORNING  . [DISCONTINUED] metFORMIN (GLUCOPHAGE) 500 MG tablet TAKE 1 TABLET TWICE A DAY (Patient taking differently: Take 500mg s by mouth daily)  . [DISCONTINUED] valsartan (DIOVAN) 80 MG tablet Take 80 mg by mouth at bedtime.    No facility-administered encounter medications on file as of 04/07/2017.    ALLERGIES: Allergies  Allergen Reactions  . Lisinopril Anaphylaxis    hypotension  . Celebrex [Celecoxib]     Stomach cramps    VACCINATION STATUS:  There is no  immunization history on file for this patient.  HPI  61yr old female with medical hx significant for Osteoporosis, arthritis, depression, anxiety, PUD, GERD, chronic pain, sleep disorder, polypharmacy, chronic smoking and hyponatremia. 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  She is on fluid restriction to <1500cc/day.  She feels much better.  No new complaints. Her serum Sodium has stabilized at 139. She is now on Velnafaxine, Nuvigil, Xanax, Maxalt. She denies dry mouth now. She has steady weight .  she is on MTF for type 2 DM and levothyroxine 50 mcg po qday for hypothyroidism. She has never been treated with Lithium, demeclocycline, nor Vasopressin.  Review of Systems  Constitutional: no weight gain/loss, no fatigue, no subjective hyperthermia/hypothermia Eyes: no blurry vision, no xerophthalmia ENT: no sore throat, no nodules palpated in throat, no dysphagia/odynophagia, no hoarseness Cardiovascular: no CP/SOB/palpitations/leg swelling Respiratory: no cough/SOB Gastrointestinal: no N/V/D/C Musculoskeletal: no muscle/joint aches Skin: no rashes Neurological: no tremors/numbness/tingling/dizziness Psychiatric: no depression/anxiety  Objective:    BP (!) 137/99 (BP Location: Left Arm, Patient Position: Sitting, Cuff Size: Normal)   Pulse 75   Ht 5' (1.524 m)   Wt 135 lb 3.2 oz (61.3 kg)   SpO2 100%   BMI 26.40 kg/m   Wt Readings from Last 3 Encounters:  04/07/17 135 lb 3.2 oz (61.3 kg)  02/10/17 137 lb (62.1 kg)  12/30/16 136 lb 12.8 oz (62.1 kg)    Physical Exam  Constitutional:  in NAD Eyes: PERRLA, EOMI, no exophthalmos ENT: moist mucous membranes, no thyromegaly, no cervical lymphadenopathy Cardiovascular: RRR, No MRG Respiratory: CTA B Gastrointestinal: abdomen soft, NT, ND, BS+ Musculoskeletal: no deformities, strength intact in all 4 Skin: moist, warm, no rashes Neurological: no tremor with outstretched  hands, DTR normal in all 4  Labs from 03/31/2017 showed sodium 139, BUN 11, creatinine 0.61. TSH 1.06, free T4 104. Labs from 02/13/2016 showed A1c is 6%, free T4 1.02, TSH 0.85, sodium 138, BUN 13, creatinine 0.78 Labs from 08/01/15: Free t4 1.19, TSH 1.94, a1c , 5.9, Na 138.  Labs from 08/19/2016 showed sodium 137, A1c 5.9%, TSH 0.54, ft4 1.03.  Assessment & Plan:   1. Primary hypothyroidism -Her thyroid function tests indicate that she is being replaced appropriately. I advised her to continue levothyroxine 50 g by mouth every morning.  - We discussed about correct intake of levothyroxine, at fasting, with water, separated by at least 30 minutes from breakfast, and separated by more than 4 hours from calcium, iron, multivitamins, acid reflux medications (PPIs). -Patient is made aware of the fact that thyroid hormone replacement is needed for life, dose to be adjusted by periodic monitoring of thyroid function tests. - Continue LT4 50  mcg po qday for hypothyroidism. -we discussed the correct way to take her Lt4 in the AM.  2. Diabetes mellitus without complication ([place]) Her last visit a1c is stable  at  5.9% c/w controlled type 2 DM. I gave her detailed diabetes education and she agrees to go on metformin 500mg  po qday. she will adopt CHO restricted diet.   3. Polydipsia/Hyponatremia  Her most recent Sodium is stable at 139. She is advised to continue moderate fluid restriction to 1500cc/24 hours. 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 She is clinically euvolemic and asymptomatic today.  Pt's prior 24 hour urine Aldosterone and cortisol were within normal limits.   Her BP is better , she reacted to Lisinopril, but doing well on valsartan.  - I advised patient to maintain close follow up with PMD for primary care needs.  Follow up plan: Return in about 6 months (around 10/08/2017) for follow up with  pre-visit labs.  Glade Lloyd, MD Phone: [phone]  Fax: [phone]   04/07/2017, 2:53 PM

## 2017-04-14 ENCOUNTER — Ambulatory Visit (INDEPENDENT_AMBULATORY_CARE_PROVIDER_SITE_OTHER): Payer: BLUE CROSS/BLUE SHIELD | Admitting: Internal Medicine

## 2017-04-14 ENCOUNTER — Encounter (INDEPENDENT_AMBULATORY_CARE_PROVIDER_SITE_OTHER): Payer: Self-pay | Admitting: Internal Medicine

## 2017-04-14 VITALS — BP 120/78 | Temp 98.0°F | Ht 60.0 in | Wt 138.4 lb

## 2017-04-14 DIAGNOSIS — D500 Iron deficiency anemia secondary to blood loss (chronic): Secondary | ICD-10-CM

## 2017-04-14 LAB — CBC WITH DIFFERENTIAL/PLATELET
Basophils Absolute: 64 cells/uL (ref 0–200)
Basophils Relative: 1 %
EOS PCT: 2 %
Eosinophils Absolute: 128 cells/uL (ref 15–500)
HEMATOCRIT: 38.6 % (ref 35.0–45.0)
Hemoglobin: 13.1 g/dL (ref 11.7–15.5)
LYMPHS PCT: 35 %
Lymphs Abs: 2240 cells/uL (ref 850–3900)
MCH: 30.3 pg (ref 27.0–33.0)
MCHC: 33.9 g/dL (ref 32.0–36.0)
MCV: 89.4 fL (ref 80.0–100.0)
MONOS PCT: 11 %
MPV: 9.5 fL (ref 7.5–12.5)
Monocytes Absolute: 704 cells/uL (ref 200–950)
NEUTROS PCT: 51 %
Neutro Abs: 3264 cells/uL (ref 1500–7800)
PLATELETS: 225 10*3/uL (ref 140–400)
RBC: 4.32 MIL/uL (ref 3.80–5.10)
RDW: 14.1 % (ref 11.0–15.0)
WBC: 6.4 10*3/uL (ref 3.8–10.8)

## 2017-04-14 NOTE — Patient Instructions (Signed)
Cbc today. OV in 1 year Continue to Protonix

## 2017-04-14 NOTE — Progress Notes (Signed)
Subjective:    Patient ID: Lori [name], female    DOB: [birth year], 61 y.o.   MRN: [id number]  HPI Here today for f/u.  Underwent an EGD for IDA  Due to Chronic blood loss in June of this year. Impression:               - Z-line regular, 34 cm from the incisors.                           - 4 cm hiatal hernia.                           - Gastritis. Biopsied.                           - Scar in the gastric antrum.                           - Non-bleeding gastric ulcers with no stigmata of                            bleeding.                           - One non-bleeding duodenal ulcer with no stigmata                            of bleeding.                           - Normal second portion of the duodenum. She tells me she is doing good. She states she had blood work in June and was normal ( hemoglobin). No melena or BRRB. Has 2 stools a day.  Appetite is good. No weight loss. She has actually gained 2 pounds.  She tells me today she had been taking Goody Powder x 1 a day and not once a month.  Review of Systems Past Medical History:  Diagnosis Date  . Anemia   . Anxiety   . Cancer ([place])    skin  . Depression   . Diabetes mellitus without complication ([place])   . Hypertension   . Hypothyroidism   . Sleep apnea   . Thyroid disease     Past Surgical History:  Procedure Laterality Date  . APPENDECTOMY    . BACK SURGERY     L4, L5  . BIOPSY  02/10/2017   Procedure: BIOPSY;  Surgeon: Rogene Houston, MD;  [facility];  Service: Endoscopy;;  gastric   . BREAST ENHANCEMENT SURGERY    . CESAREAN SECTION    . ESOPHAGOGASTRODUODENOSCOPY N/A 02/10/2017   Procedure: ESOPHAGOGASTRODUODENOSCOPY (EGD);  Surgeon: Rogene Houston, MD;  [facility];  Service: Endoscopy;  Laterality: N/A;  . eye lid surgery    . ROTATOR CUFF REPAIR      Allergies  Allergen Reactions  . Lisinopril Anaphylaxis    hypotension  . Celebrex [Celecoxib]     Stomach cramps      Current Outpatient Prescriptions on File Prior to Visit  Medication Sig Dispense Refill  . ALPRAZolam (XANAX) 0.5 MG tablet Take 0.5 mg by mouth at bedtime as needed  for anxiety.    . butalbital-aspirin-caffeine-codeine (FIORINAL WITH CODEINE) 50-325-40-30 MG capsule Take 1 capsule by mouth 3 (three) times daily as needed for pain or headache.    . Calcium Citrate (CITRACAL PO) Take 1 tablet by mouth once a week.     . denosumab (PROLIA) 60 MG/ML SOLN injection Inject 60 mg into the skin every 6 (six) months. Administer in upper arm, thigh, or abdomen    . Docusate Calcium (STOOL SOFTENER PO) Take 2-3 tablets by mouth at bedtime.    . FeFum-FePo-FA-B Cmp-C-Zn-Mn-Cu (TANDEM PLUS) 162-115.2-1 MG CAPS Take 1 capsule by mouth daily.     Marland Kitchen gabapentin (NEURONTIN) 300 MG capsule One tab PO qHS for a week, then BID for a week, then TID. May double weekly to a max of 3,600mg /day (Patient taking differently: Take 300 mg by mouth 3 (three) times daily. ) 180 capsule 3  . HYDROcodone-acetaminophen (NORCO) 10-325 MG per tablet Take 1 tablet by mouth every 6 (six) hours as needed for moderate pain.     Marland Kitchen levothyroxine (SYNTHROID, LEVOTHROID) 50 MCG tablet Take 1 tablet (50 mcg total) by mouth every morning. 90 tablet 2  . Liraglutide -Weight Management (SAXENDA) 18 MG/3ML SOPN Inject 3 mg into the skin daily.     . metFORMIN (GLUCOPHAGE) 500 MG tablet Take 1 tablet (500 mg total) by mouth daily with breakfast. 90 tablet 2  . pantoprazole (PROTONIX) 40 MG tablet Take 1 tablet (40 mg total) by mouth 2 (two) times daily before a meal. 60 tablet 2  . Polyvinyl Alcohol-Povidone (REFRESH OP) Apply 1 drop to eye daily.    . rizatriptan (MAXALT-MLT) 5 MG disintegrating tablet Take 5 mg by mouth as needed for migraine. May repeat in 2 hours if needed    . tiZANidine (ZANAFLEX) 2 MG tablet Take 2 mg by mouth daily as needed for muscle spasms.     Marland Kitchen venlafaxine XR (EFFEXOR-XR) 150 MG 24 hr capsule Take 150 mg by mouth  at bedtime.     . Vitamin D, Ergocalciferol, (DRISDOL) 50000 units CAPS capsule Take 50,000 Units by mouth every Monday.     No current facility-administered medications on file prior to visit.         Objective:   Physical Exam Blood pressure 120/78, temperature 98 F (36.7 C), height 5' (1.524 m), weight 138 lb 6.4 oz (62.8 kg). Alert and oriented. Skin warm and dry. Oral mucosa is moist.   . Sclera anicteric, conjunctivae is pink. Thyroid not enlarged. No cervical lymphadenopathy. Lungs clear. Heart regular rate and rhythm.  Abdomen is soft. Bowel sounds are positive. No hepatomegaly. No abdominal masses felt. No tenderness.  No edema to lower extremities. Patient is alert and oriented.         Assessment & Plan:  PUD. Continue the Protonix. She is doing well. CBC today.  OV in 1 year.

## 2017-04-16 ENCOUNTER — Other Ambulatory Visit (INDEPENDENT_AMBULATORY_CARE_PROVIDER_SITE_OTHER): Payer: Self-pay | Admitting: *Deleted

## 2017-04-16 DIAGNOSIS — D500 Iron deficiency anemia secondary to blood loss (chronic): Secondary | ICD-10-CM

## 2017-04-21 ENCOUNTER — Other Ambulatory Visit: Payer: Self-pay | Admitting: "Endocrinology

## 2017-06-14 ENCOUNTER — Other Ambulatory Visit: Payer: Self-pay | Admitting: "Endocrinology

## 2017-07-21 ENCOUNTER — Other Ambulatory Visit: Payer: Self-pay | Admitting: "Endocrinology

## 2017-08-27 ENCOUNTER — Encounter (INDEPENDENT_AMBULATORY_CARE_PROVIDER_SITE_OTHER): Payer: Self-pay | Admitting: Internal Medicine

## 2017-09-24 IMAGING — CR DG RIBS W/ CHEST 3+V*L*
3 series · 3 of 3 positions shown · non-contrast
Comparison: None.

CLINICAL DATA: Pain in LEFT anterior ribs. Pain with deep
inspiration

EXAM:
LEFT RIBS AND CHEST - 3+ VIEW

[chest pa]
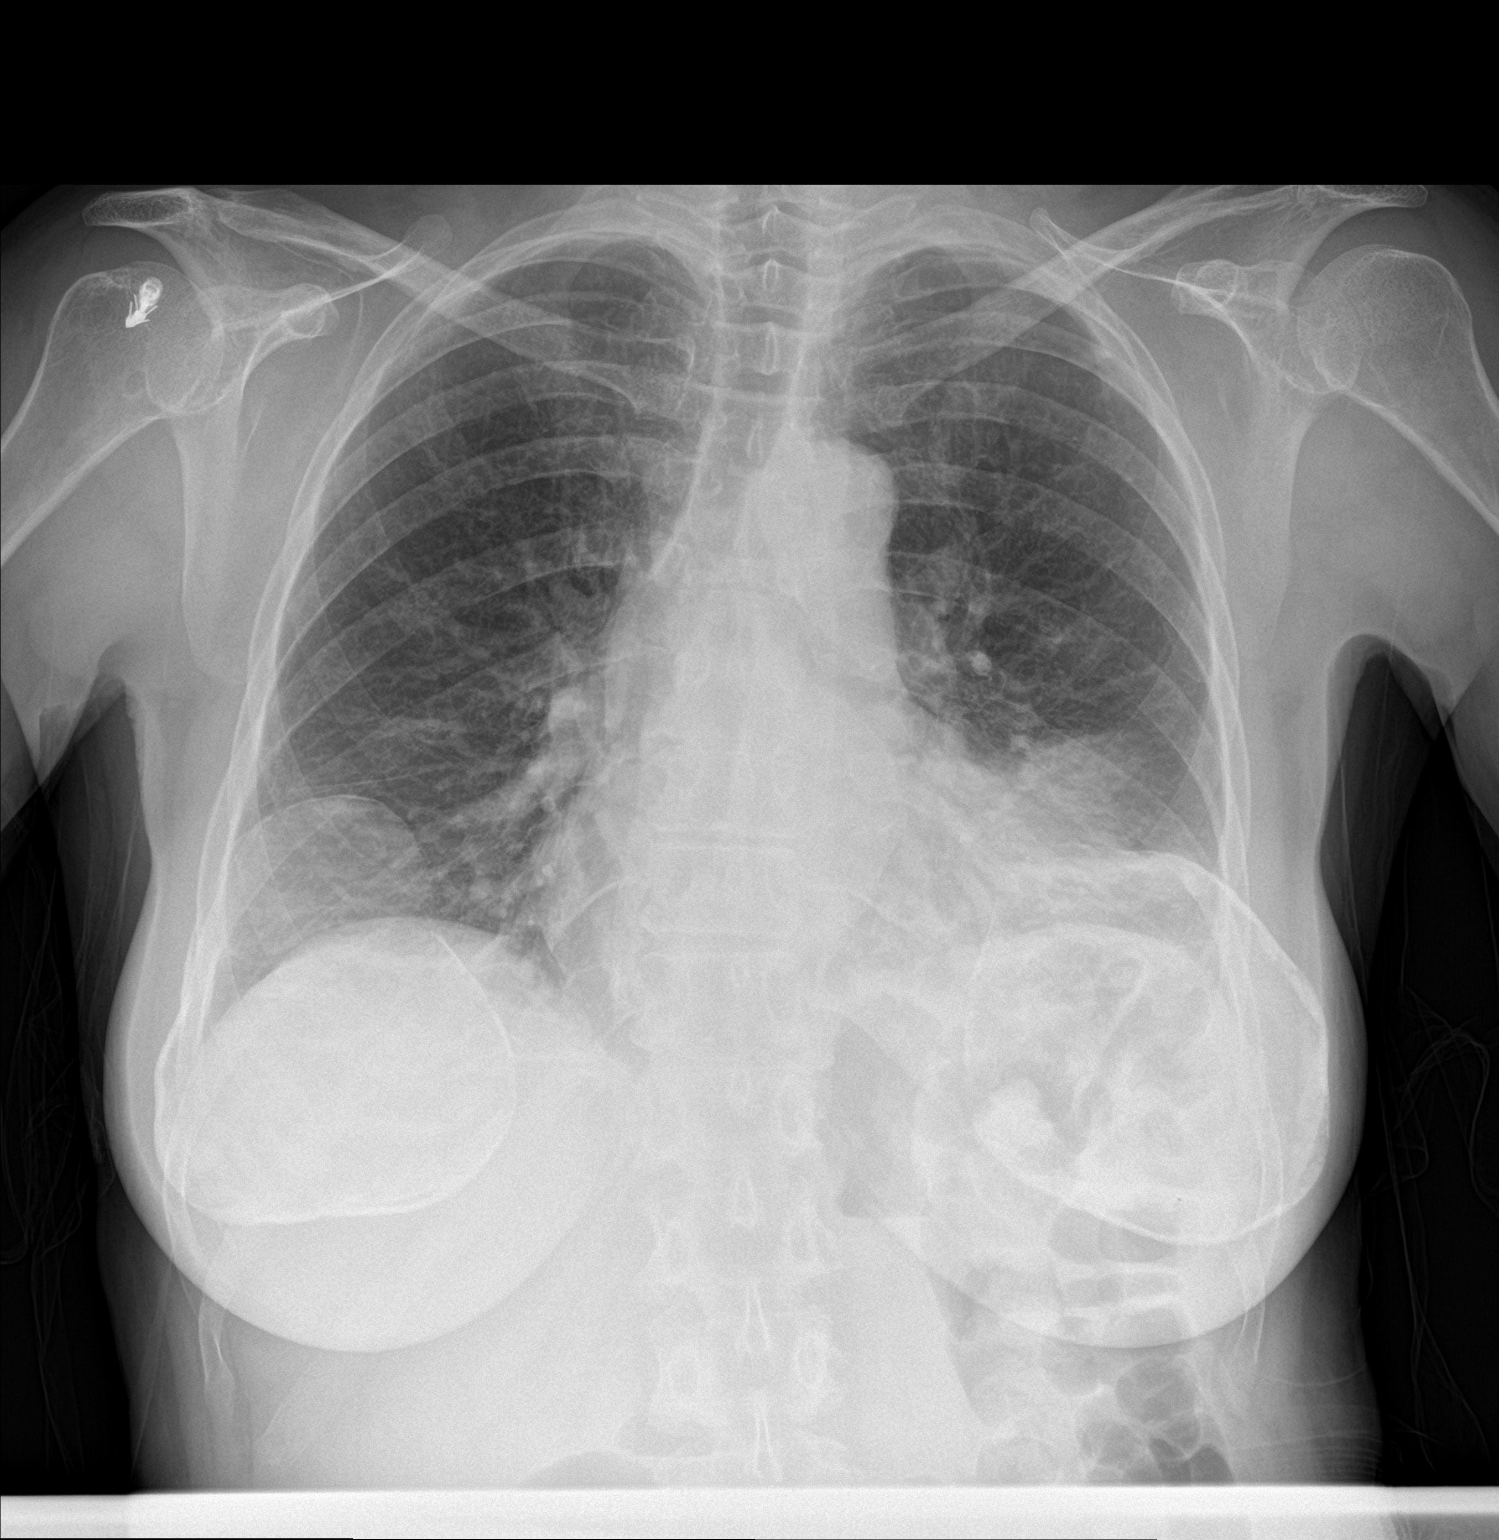

[rib ap]
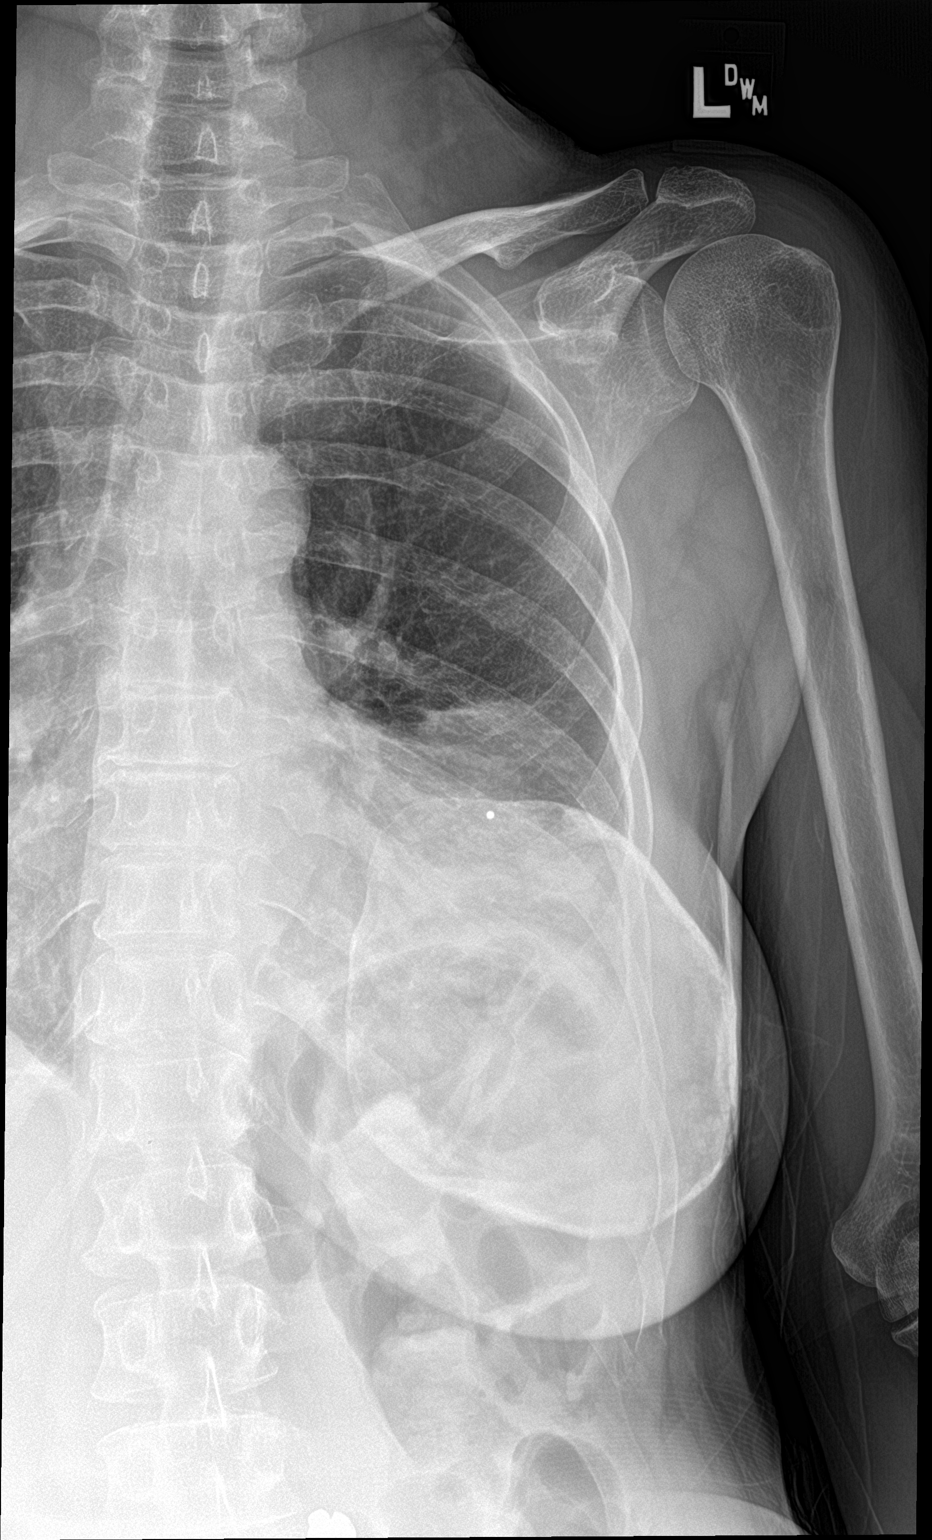

[rib ap obl]
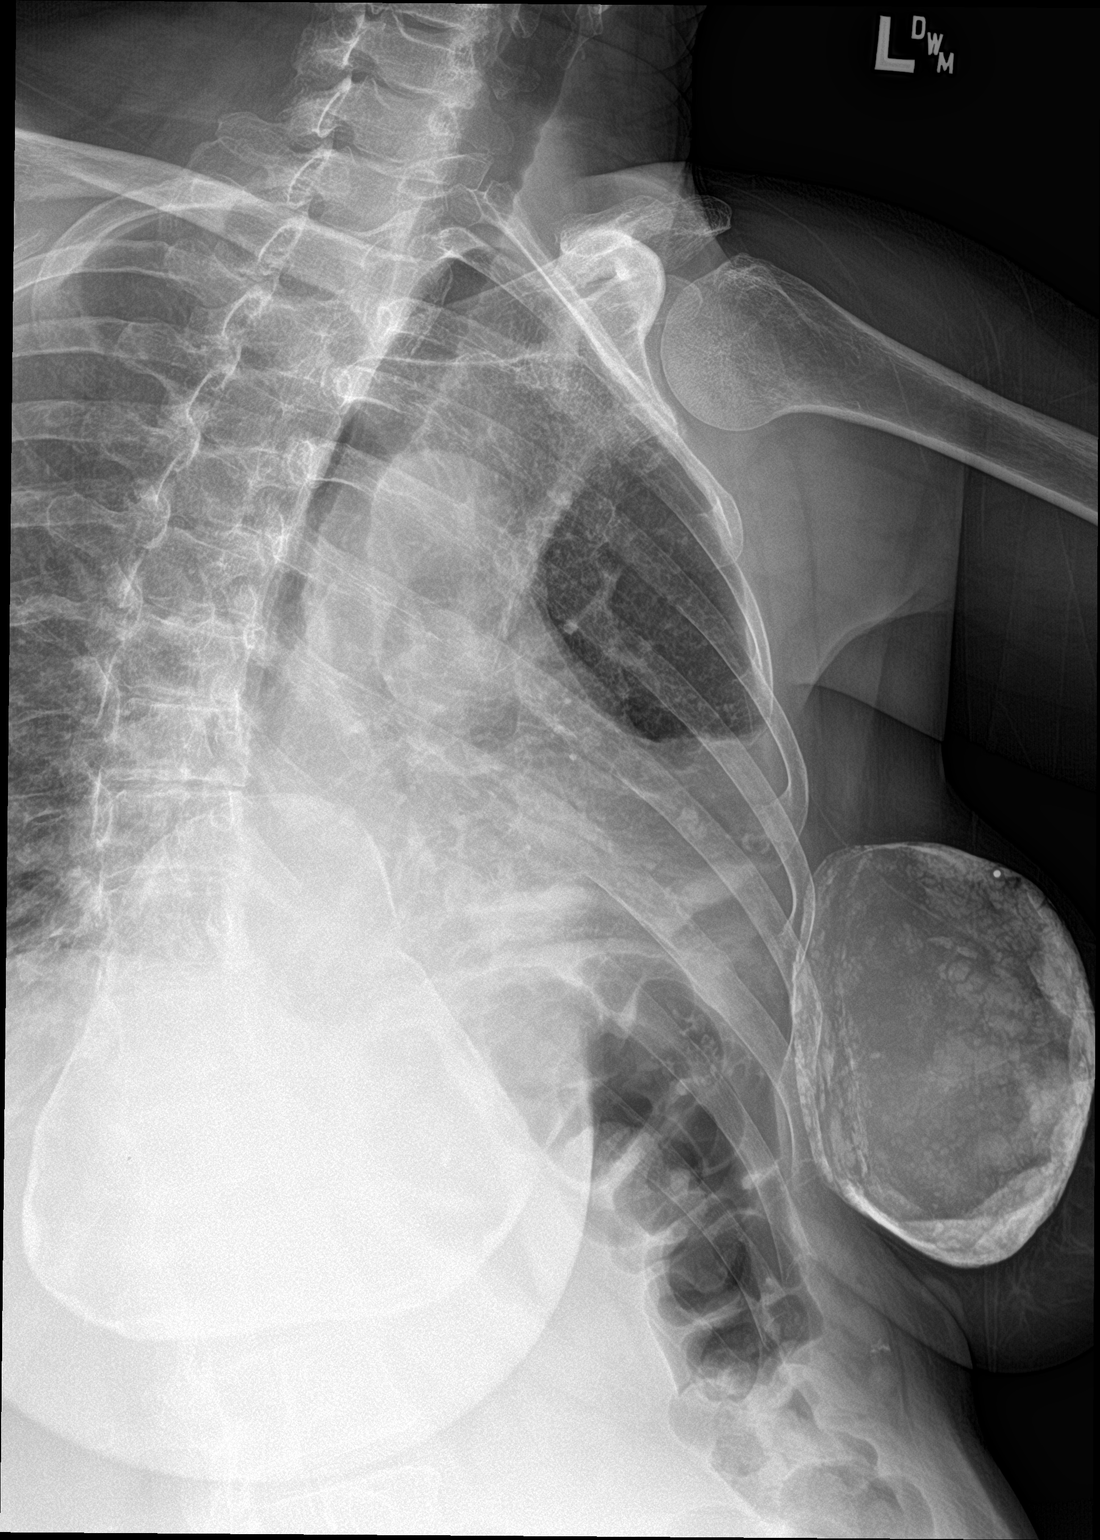

[3 of 3 positions shown; findings below may reference images not displayed]

FINDINGS: Normal cardiac silhouette. Bilateral breast implants noted. There is
a airspace density in the LEFT upper lobe obscuring the LEFT heart
border.

Dedicated views of the LEFT ribs no acute fractures. Healed fracture
of the LEFT lateral eighth rib.
IMPRESSION: 1. LEFT upper lobe pneumonia. Followup PA and lateral chest X-ray is
recommended in 3-4 weeks following trial of antibiotic therapy to
ensure resolution and exclude underlying malignancy.
2 . no evidence of acute rib fracture.

## 2017-10-03 ENCOUNTER — Encounter (INDEPENDENT_AMBULATORY_CARE_PROVIDER_SITE_OTHER): Payer: Self-pay | Admitting: *Deleted

## 2017-10-03 ENCOUNTER — Other Ambulatory Visit (INDEPENDENT_AMBULATORY_CARE_PROVIDER_SITE_OTHER): Payer: Self-pay | Admitting: *Deleted

## 2017-10-03 DIAGNOSIS — D500 Iron deficiency anemia secondary to blood loss (chronic): Secondary | ICD-10-CM

## 2017-10-13 ENCOUNTER — Ambulatory Visit: Payer: BLUE CROSS/BLUE SHIELD | Admitting: "Endocrinology

## 2017-10-19 ENCOUNTER — Other Ambulatory Visit: Payer: Self-pay | Admitting: "Endocrinology

## 2017-10-28 LAB — BASIC METABOLIC PANEL
BUN: 14 (ref 4–21)
Creatinine: 0.6

## 2017-10-28 LAB — HEMOGLOBIN A1C: Hemoglobin A1C: 5.5

## 2017-10-28 LAB — TSH: TSH: 0.91 (ref 0.41–5.90)

## 2017-10-29 ENCOUNTER — Encounter: Payer: Self-pay | Admitting: "Endocrinology

## 2017-10-29 ENCOUNTER — Ambulatory Visit (INDEPENDENT_AMBULATORY_CARE_PROVIDER_SITE_OTHER): Payer: BLUE CROSS/BLUE SHIELD | Admitting: "Endocrinology

## 2017-10-29 VITALS — BP 138/81 | HR 73 | Ht 60.0 in | Wt 140.0 lb

## 2017-10-29 DIAGNOSIS — E039 Hypothyroidism, unspecified: Secondary | ICD-10-CM

## 2017-10-29 DIAGNOSIS — R631 Polydipsia: Secondary | ICD-10-CM

## 2017-10-29 DIAGNOSIS — E119 Type 2 diabetes mellitus without complications: Secondary | ICD-10-CM | POA: Diagnosis not present

## 2017-10-29 NOTE — Progress Notes (Signed)
Subjective:    Patient ID: Lori [name], adult    DOB: [birth year],   Past Medical History:  Diagnosis Date  . Anemia   . Anxiety   . Cancer ([place])    skin  . Depression   . Diabetes mellitus without complication ([place])   . Hypertension   . Hypothyroidism   . Sleep apnea   . Thyroid disease    Past Surgical History:  Procedure Laterality Date  . APPENDECTOMY    . BACK SURGERY     L4, L5  . BIOPSY  02/10/2017   Procedure: BIOPSY;  Surgeon: Rogene Houston, MD;  [facility];  Service: Endoscopy;;  gastric   . BREAST ENHANCEMENT SURGERY    . CESAREAN SECTION    . ESOPHAGOGASTRODUODENOSCOPY N/A 02/10/2017   Procedure: ESOPHAGOGASTRODUODENOSCOPY (EGD);  Surgeon: Rogene Houston, MD;  [facility];  Service: Endoscopy;  Laterality: N/A;  . eye lid surgery    . ROTATOR CUFF REPAIR     Social History   Socioeconomic History  . Marital status: Married    Spouse name: None  . Number of children: None  . Years of education: None  . Highest education level: None  Social Needs  . Financial resource strain: None  . Food insecurity - worry: None  . Food insecurity - inability: None  . Transportation needs - medical: None  . Transportation needs - non-medical: None  Occupational History  . None  Tobacco Use  . Smoking status: Former Smoker    Last attempt to quit: 09/12/2012    Years since quitting: 5.1  . Smokeless tobacco: Never Used  . Tobacco comment: Chews nicorette gum  Substance and Sexual Activity  . Alcohol use: No  . Drug use: No  . Sexual activity: No  Other Topics Concern  . None  Social History Narrative  . None   Outpatient Encounter Medications as of 10/29/2017  Medication Sig  . ALPRAZolam (XANAX) 0.5 MG tablet Take 0.5 mg by mouth at bedtime as needed for anxiety.  . butalbital-aspirin-caffeine-codeine (FIORINAL WITH CODEINE) 50-325-40-30 MG capsule Take 1 capsule by mouth 3 (three) times daily as needed for pain or headache.   . Calcium Citrate (CITRACAL PO) Take 1 tablet by mouth once a week.   . denosumab (PROLIA) 60 MG/ML SOLN injection Inject 60 mg into the skin every 6 (six) months. Administer in upper arm, thigh, or abdomen  . Docusate Calcium (STOOL SOFTENER PO) Take 2-3 tablets by mouth at bedtime.  . FeFum-FePo-FA-B Cmp-C-Zn-Mn-Cu (TANDEM PLUS) 162-115.2-1 MG CAPS Take 1 capsule by mouth daily.   Marland Kitchen gabapentin (NEURONTIN) 300 MG capsule One tab PO qHS for a week, then BID for a week, then TID. May double weekly to a max of 3,600mg /day (Patient taking differently: Take 300 mg by mouth 3 (three) times daily. )  . HYDROcodone-acetaminophen (NORCO) 10-325 MG per tablet Take 1 tablet by mouth every 6 (six) hours as needed for moderate pain.   Marland Kitchen levothyroxine (SYNTHROID, LEVOTHROID) 50 MCG tablet TAKE 1 TABLET EVERY MORNING  . Liraglutide -Weight Management (SAXENDA) 18 MG/3ML SOPN Inject 3 mg into the skin daily.   Marland Kitchen losartan (COZAAR) 100 MG tablet Take 100 mg by mouth daily.  . metFORMIN (GLUCOPHAGE) 500 MG tablet TAKE 1 TABLET DAILY WITH BREAKFAST  . pantoprazole (PROTONIX) 40 MG tablet Take 1 tablet (40 mg total) by mouth 2 (two) times daily before a meal.  . Polyvinyl Alcohol-Povidone (REFRESH OP) Apply  1 drop to eye daily.  . rizatriptan (MAXALT-MLT) 5 MG disintegrating tablet Take 5 mg by mouth as needed for migraine. May repeat in 2 hours if needed  . tiZANidine (ZANAFLEX) 2 MG tablet Take 2 mg by mouth daily as needed for muscle spasms.   Marland Kitchen venlafaxine XR (EFFEXOR-XR) 150 MG 24 hr capsule Take 150 mg by mouth at bedtime.   . Vitamin D, Ergocalciferol, (DRISDOL) 50000 units CAPS capsule Take 50,000 Units by mouth 2 (two) times a week.   . [DISCONTINUED] levothyroxine (SYNTHROID, LEVOTHROID) 50 MCG tablet Take 1 tablet (50 mcg total) by mouth every morning.  . [DISCONTINUED] metFORMIN (GLUCOPHAGE) 500 MG tablet Take 1 tablet (500 mg total) by mouth daily with breakfast.   No facility-administered encounter  medications on file as of 10/29/2017.    ALLERGIES: Allergies  Allergen Reactions  . Lisinopril Anaphylaxis    hypotension  . Celebrex [Celecoxib]     Stomach cramps    VACCINATION STATUS:  There is no immunization history on file for this patient.  HPI  62 yr old female with medical hx significant for Osteoporosis, arthritis, depression, anxiety, PUD, GERD, chronic pain, sleep disorder, polypharmacy, chronic smoking and hyponatremia. 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  She is on fluid restriction to <1500cc/day.  She feels much better.  No new complaints. Her serum Sodium has stabilized at 138.  She denies dry mouth now. She has steady weight .  she is on metformin 500 mg p.o. daily for type 2 diabetes, levothyroxine 50 mcg p.o. every morning for hypothyroidism.    She denies any exposure to lithium,  demeclocycline, nor Vasopressin.  Review of Systems  Constitutional:  + She has a steady weight,  no fatigue, no subjective hyperthermia/hypothermia Eyes: no blurry vision, no xerophthalmia ENT: no sore throat, no nodules palpated in throat, no dysphagia/odynophagia, no hoarseness Cardiovascular: No chest pain, no palpitations, no leg swelling.    Respiratory: no cough/SOB Gastrointestinal: no N/V/D/C Musculoskeletal: no muscle/joint aches Skin: no rashes Neurological: no tremors/numbness/tingling/dizziness Psychiatric: no depression/anxiety  Objective:    BP 138/81   Pulse 73   Ht 5' (1.524 m)   Wt 140 lb (63.5 kg)   BMI 27.34 kg/m   Wt Readings from Last 3 Encounters:  10/29/17 140 lb (63.5 kg)  04/14/17 138 lb 6.4 oz (62.8 kg)  04/07/17 135 lb 3.2 oz (61.3 kg)    Physical Exam  Constitutional:  in NAD Eyes: PERRLA, EOMI, no exophthalmos ENT: moist mucous membranes, no thyromegaly, no cervical lymphadenopathy Cardiovascular: RRR, No MRG Respiratory: CTA B Gastrointestinal: abdomen  soft, NT, ND, BS+ Musculoskeletal: no deformities, strength intact in all 4 Skin: moist, warm, no rashes Neurological: no tremor with outstretched hands  Labs from October 28, 2017 TSH 0.8 2, free T4 0.9 1, A1c 5.5%, sodium 138, potassium 4.6 Labs from 03/31/2017 showed sodium 139, BUN 11, creatinine 0.61. TSH 1.06, free T4 104. Labs from 02/13/2016 showed A1c is 6%, free T4 1.02, TSH 0.85, sodium 138, BUN 13, creatinine 0.78 Labs from 08/01/15: Free t4 1.19, TSH 1.94, a1c , 5.9, Na 138.  Labs from 08/19/2016 showed sodium 137, A1c 5.9%, TSH 0.54, ft4 1.03.  Assessment & Plan:   1. Primary hypothyroidism -Her thyroid function tests are consistent with appropriate replacement. I advised her to continue levothyroxine 50 g by mouth every morning.  - We discussed about correct intake of levothyroxine, at fasting, with water, separated  by at least 30 minutes from breakfast, and separated by more than 4 hours from calcium, iron, multivitamins, acid reflux medications (PPIs). -Patient is made aware of the fact that thyroid hormone replacement is needed for life, dose to be adjusted by periodic monitoring of thyroid function tests.  2. Diabetes mellitus without complication (HCC) Her last visit a1c is stable  at  5.5% c/w controlled type 2 DM. -  Suggestion is made for her to avoid simple carbohydrates  from her diet including Cakes, Sweet Desserts / Pastries, Ice Cream, Soda (diet and regular), Sweet Tea, Candies, Chips, Cookies, Store Bought Juices, Alcohol in Excess of  1-2 drinks a day, Artificial Sweeteners, and "Sugar-free" Products. This will help patient to have stable blood glucose profile and potentially avoid unintended weight gain. -  she agrees to go on metformin 500mg  po qday.  If her A1c remains below 6%, she will be considered off metformin next visit.  3. Polydipsia/Hyponatremia  Her most recent Sodium is stable at 138. She is advised to continue moderate fluid restriction to  1500cc/24 hours. 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 She is clinically euvolemic and asymptomatic today.  Pt's prior 24 hour urine Aldosterone and cortisol were within normal limits.   Her BP is better , she reacted to Lisinopril, but doing well on valsartan.  - I advised patient to maintain close follow up with PMD for primary care needs.  Follow up plan: Return in about 6 months (around 04/28/2018) for follow up with pre-visit labs.  Glade Lloyd, MD Phone: [phone]  Fax: [phone]  -  This note was partially dictated with voice recognition software. Similar sounding words can be transcribed inadequately or may not  be corrected upon review.  10/29/2017, 4:51 PM

## 2017-12-16 ENCOUNTER — Other Ambulatory Visit: Payer: Self-pay

## 2017-12-16 MED ORDER — LEVOTHYROXINE SODIUM 50 MCG PO TABS: 50.0000 ug | ORAL_TABLET | Freq: Every morning | ORAL | 1 refills | Status: DC

## 2018-04-14 ENCOUNTER — Ambulatory Visit (INDEPENDENT_AMBULATORY_CARE_PROVIDER_SITE_OTHER): Payer: BLUE CROSS/BLUE SHIELD | Admitting: Internal Medicine

## 2018-04-29 ENCOUNTER — Ambulatory Visit: Payer: BLUE CROSS/BLUE SHIELD | Admitting: "Endocrinology

## 2018-05-06 ENCOUNTER — Encounter: Payer: Self-pay | Admitting: "Endocrinology

## 2018-05-06 ENCOUNTER — Ambulatory Visit (INDEPENDENT_AMBULATORY_CARE_PROVIDER_SITE_OTHER): Payer: BLUE CROSS/BLUE SHIELD | Admitting: "Endocrinology

## 2018-05-06 VITALS — BP 136/80 | HR 70 | Ht 60.0 in | Wt 139.0 lb

## 2018-05-06 DIAGNOSIS — E119 Type 2 diabetes mellitus without complications: Secondary | ICD-10-CM | POA: Diagnosis not present

## 2018-05-06 DIAGNOSIS — E039 Hypothyroidism, unspecified: Secondary | ICD-10-CM | POA: Diagnosis not present

## 2018-05-06 DIAGNOSIS — R631 Polydipsia: Secondary | ICD-10-CM

## 2018-05-06 MED ORDER — LEVOTHYROXINE SODIUM 75 MCG PO TABS: 75.0000 ug | ORAL_TABLET | Freq: Every morning | ORAL | 1 refills | Status: DC

## 2018-05-06 NOTE — Progress Notes (Signed)
Endocrinology follow-up note   Subjective:    Patient ID: Orpah Cobb, adult    DOB: 01/17/1956,   Past Medical History:  Diagnosis Date  . Anemia   . Anxiety   . Cancer ([place])    skin  . Depression   . Diabetes mellitus without complication ([place])   . Hypertension   . Hypothyroidism   . Sleep apnea   . Thyroid disease    Past Surgical History:  Procedure Laterality Date  . APPENDECTOMY    . BACK SURGERY     L4, L5  . BIOPSY  02/10/2017   Procedure: BIOPSY;  Surgeon: Rogene Houston, MD;  [facility];  Service: Endoscopy;;  gastric   . BREAST ENHANCEMENT SURGERY    . CESAREAN SECTION    . ESOPHAGOGASTRODUODENOSCOPY N/A 02/10/2017   Procedure: ESOPHAGOGASTRODUODENOSCOPY (EGD);  Surgeon: Rogene Houston, MD;  [facility];  Service: Endoscopy;  Laterality: N/A;  . eye lid surgery    . ROTATOR CUFF REPAIR     Social History   Socioeconomic History  . Marital status: Married    Spouse name: Not on file  . Number of children: Not on file  . Years of education: Not on file  . Highest education level: Not on file  Occupational History  . Not on file  Social Needs  . Financial resource strain: Not on file  . Food insecurity:    Worry: Not on file    Inability: Not on file  . Transportation needs:    Medical: Not on file    Non-medical: Not on file  Tobacco Use  . Smoking status: Former Smoker    Last attempt to quit: 09/12/2012    Years since quitting: 5.6  . Smokeless tobacco: Never Used  . Tobacco comment: Chews nicorette gum  Substance and Sexual Activity  . Alcohol use: No  . Drug use: No  . Sexual activity: Never  Lifestyle  . Physical activity:    Days per week: Not on file    Minutes per session: Not on file  . Stress: Not on file  Relationships  . Social connections:    Talks on phone: Not on file    Gets together: Not on file    Attends religious service: Not on file    Active member of club or organization: Not on  file    Attends meetings of clubs or organizations: Not on file    Relationship status: Not on file  Other Topics Concern  . Not on file  Social History Narrative  . Not on file   Outpatient Encounter Medications as of 05/06/2018  Medication Sig  . ALPRAZolam (XANAX) 0.5 MG tablet Take 0.5 mg by mouth at bedtime as needed for anxiety.  . butalbital-aspirin-caffeine-codeine (FIORINAL WITH CODEINE) 50-325-40-30 MG capsule Take 1 capsule by mouth 3 (three) times daily as needed for pain or headache.  . Calcium Citrate (CITRACAL PO) Take 1 tablet by mouth once a week.   . denosumab (PROLIA) 60 MG/ML SOLN injection Inject 60 mg into the skin every 6 (six) months. Administer in upper arm, thigh, or abdomen  . Docusate Calcium (STOOL SOFTENER PO) Take 2-3 tablets by mouth at bedtime.  . FeFum-FePo-FA-B Cmp-C-Zn-Mn-Cu (TANDEM PLUS) 162-115.2-1 MG CAPS Take 1 capsule by mouth daily.   Marland Kitchen gabapentin (NEURONTIN) 300 MG capsule One tab PO qHS for a week, then BID for a week, then TID. May double weekly to a max of  3,600mg /day (Patient taking differently: Take 300 mg by mouth 3 (three) times daily. )  . HYDROcodone-acetaminophen (NORCO) 10-325 MG per tablet Take 1 tablet by mouth every 6 (six) hours as needed for moderate pain.   Marland Kitchen levothyroxine (SYNTHROID, LEVOTHROID) 75 MCG tablet Take 1 tablet (75 mcg total) by mouth every morning.  Marland Kitchen losartan (COZAAR) 100 MG tablet Take 100 mg by mouth daily.  . pantoprazole (PROTONIX) 40 MG tablet Take 1 tablet (40 mg total) by mouth 2 (two) times daily before a meal.  . Polyvinyl Alcohol-Povidone (REFRESH OP) Apply 1 drop to eye daily.  . rizatriptan (MAXALT-MLT) 5 MG disintegrating tablet Take 5 mg by mouth as needed for migraine. May repeat in 2 hours if needed  . tiZANidine (ZANAFLEX) 2 MG tablet Take 2 mg by mouth daily as needed for muscle spasms.   Marland Kitchen venlafaxine XR (EFFEXOR-XR) 150 MG 24 hr capsule Take 150 mg by mouth at bedtime.   . Vitamin D, Ergocalciferol,  (DRISDOL) 50000 units CAPS capsule Take 50,000 Units by mouth 2 (two) times a week.   . [DISCONTINUED] levothyroxine (SYNTHROID, LEVOTHROID) 50 MCG tablet Take 1 tablet (50 mcg total) by mouth every morning.  . [DISCONTINUED] metFORMIN (GLUCOPHAGE) 500 MG tablet TAKE 1 TABLET DAILY WITH BREAKFAST   No facility-administered encounter medications on file as of 05/06/2018.    ALLERGIES: Allergies  Allergen Reactions  . Lisinopril Anaphylaxis    hypotension  . Celebrex [Celecoxib]     Stomach cramps    VACCINATION STATUS:  There is no immunization history on file for this patient.  HPI  62 year old female with medical history  significant for Osteoporosis, arthritis, depression, anxiety, PUD, GERD, chronic pain, sleep disorder, polypharmacy, chronic smoking and hyponatremia. 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  She is on fluid restriction to <1500cc/day.  She feels much better.  No new complaints. Her serum Sodium has stabilized at 138 -140.  She denies dry mouth now. She has steady weight .  she is on metformin 500 mg p.o. daily for type 2 diabetes.  She is also on levothyroxine 50 mcg p.o. nightly for hypothyroidism.  She denies any exposure to lithium,  demeclocycline, nor Vasopressin.  Review of Systems  Constitutional:  + Steady weight,   no fatigue, no subjective hyperthermia/hypothermia Eyes: no blurry vision, no xerophthalmia ENT: no sore throat, no nodules palpated in throat, no dysphagia/odynophagia, no hoarseness Cardiovascular: No chest pain, no palpitations, no leg swelling.    Musculoskeletal: no muscle/joint aches Skin: no rashes Neurological: no tremors/numbness/tingling/dizziness Psychiatric: no depression/anxiety  Objective:    BP 136/80   Pulse 70   Ht 5' (1.524 m)   Wt 139 lb (63 kg)   BMI 27.15 kg/m   Wt Readings from Last 3 Encounters:  05/06/18 139 lb (63 kg)  10/29/17 140 lb  (63.5 kg)  04/14/17 138 lb 6.4 oz (62.8 kg)    Physical Exam  Constitutional:   Not in acute distress.   Eyes: PERRLA, EOMI, no exophthalmos ENT: moist mucous membranes, no thyromegaly, no cervical lymphadenopathy Musculoskeletal: no deformities, strength intact in all 4 Skin: moist, warm, no rashes Neurological: no tremor with outstretched hands   Labs from May 01, 2018 showed BUN 17 creatinine 0.75, sodium 140 A1c 5.4%, TSH 1.2 5, free T4 0.8 5 Labs from October 28, 2017 TSH 0.8 2, free T4 0.9 1, A1c 5.5%, sodium 138, potassium 4.6 Labs from 03/31/2017 showed sodium 139, BUN  11, creatinine 0.61. TSH 1.06, free T4 104. Labs from 02/13/2016 showed A1c is 6%, free T4 1.02, TSH 0.85, sodium 138, BUN 13, creatinine 0.78 Labs from 08/01/15: Free t4 1.19, TSH 1.94, a1c , 5.9, Na 138.  Labs from 08/19/2016 showed sodium 137, A1c 5.9%, TSH 0.54, ft4 1.03.  Assessment & Plan:   1. Primary hypothyroidism -Her thyroid function tests are consistent with appropriate replacement.  She will benefit from slight increase in her levothyroxine.  I discussed and increase her levothyroxine to 75 mg p.o. daily before breakfast .   - We discussed about correct intake of levothyroxine, at fasting, with water, separated by at least 30 minutes from breakfast, and separated by more than 4 hours from calcium, iron, multivitamins, acid reflux medications (PPIs). -Patient is made aware of the fact that thyroid hormone replacement is needed for life, dose to be adjusted by periodic monitoring of thyroid function tests. 2. Diabetes mellitus without complication (HCC) Her last visit a1c is stable  at 5.4 % c/w controlled type 2 DM. -  Suggestion is made for her to avoid simple carbohydrates  from her diet including Cakes, Sweet Desserts / Pastries, Ice Cream, Soda (diet and regular), Sweet Tea, Candies, Chips, Cookies, Store Bought Juices, Alcohol in Excess of  1-2 drinks a day, Artificial Sweeteners, and  "Sugar-free" Products. This will help patient to have stable blood glucose profile and potentially avoid unintended weight gain. -She will be taken off of metformin at this time until repeat A1c in 6 months.  3. Polydipsia/Hyponatremia  Her most recent Sodium is stable at 138. She is advised to continue moderate fluid restriction to 1500cc/24 hours. 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 She is clinically euvolemic and asymptomatic today.  Pt's prior 24 hour urine Aldosterone and cortisol were within normal limits.   Her BP is better , she reacted to Lisinopril, but doing well on valsartan.  - I advised patient to maintain close follow up with PMD for primary care needs.  Follow up plan: Return in about 6 months (around 11/06/2018) for Follow up with Pre-visit Labs.  Glade Lloyd, MD Phone: [phone]  Fax: [phone]  -  This note was partially dictated with voice recognition software. Similar sounding words can be transcribed inadequately or may not  be corrected upon review.  05/06/2018, 3:39 PM

## 2018-05-06 NOTE — Patient Instructions (Signed)

## 2018-05-12 ENCOUNTER — Ambulatory Visit (INDEPENDENT_AMBULATORY_CARE_PROVIDER_SITE_OTHER): Payer: BLUE CROSS/BLUE SHIELD | Admitting: Internal Medicine

## 2018-05-12 ENCOUNTER — Encounter (INDEPENDENT_AMBULATORY_CARE_PROVIDER_SITE_OTHER): Payer: Self-pay | Admitting: Internal Medicine

## 2018-05-12 VITALS — BP 150/90 | HR 60 | Temp 97.7°F | Ht 61.0 in | Wt 138.9 lb

## 2018-05-12 DIAGNOSIS — K279 Peptic ulcer, site unspecified, unspecified as acute or chronic, without hemorrhage or perforation: Secondary | ICD-10-CM

## 2018-05-12 DIAGNOSIS — D508 Other iron deficiency anemias: Secondary | ICD-10-CM

## 2018-05-12 DIAGNOSIS — I10 Essential (primary) hypertension: Secondary | ICD-10-CM | POA: Insufficient documentation

## 2018-05-12 LAB — HEMOGLOBIN AND HEMATOCRIT, BLOOD
HCT: 37.3 % (ref 35.0–45.0)
HEMOGLOBIN: 13 g/dL (ref 11.7–15.5)

## 2018-05-12 NOTE — Patient Instructions (Addendum)
H and H  today. OV in one year.  Try taking the Protonix once in the morning.

## 2018-05-12 NOTE — Progress Notes (Signed)
Subjective:    Patient ID: Lori [name], adult    DOB: 04/16/1956, 62 y.o.   MRN: [id number]  HPI Here today for f/u. Last seen in April of 2018. Hx of IDA. She underwent and EGD in June of 2018 which revealed 4 cm hiatal hernia. Gastritis. Scar in the antrum. Non bleeding gastric ulcers with no stigmata of  bleeding. One non-bleeding duodenal ulcer with no stigmata of bleeding. Normal second portion of the duodenum.  She tells me she is doing pretty. She is not having any abdominal powders. She rarely takes any Goody Powders now. Past hx of taking 3-4 Goody Powders daily. BMs are normal. She does take a laxative and a stool softener. She has chronic back pain.  Her last colonoscopy was in 2010 which was normal (Screening). Dr. West Carbo.  12/17/2006 EGD: Had been taking Goody Powders 3-4 a day and under a lot of stress) Giant ulcer duodenal bulb. Erosions body and antrum of stomach. Clotest negative.   CBC    Component Value Date/Time   WBC 6.4 04/14/2017 0956   RBC 4.32 04/14/2017 0956   HGB 13.1 04/14/2017 0956   HCT 38.6 04/14/2017 0956   PLT 225 04/14/2017 0956   MCV 89.4 04/14/2017 0956   MCH 30.3 04/14/2017 0956   MCHC 33.9 04/14/2017 0956   RDW 14.1 04/14/2017 0956   LYMPHSABS 2,240 04/14/2017 0956   MONOABS 704 04/14/2017 0956   EOSABS 128 04/14/2017 0956   BASOSABS 64 04/14/2017 0956      Review of Systems Past Medical History:  Diagnosis Date  . Anemia   . Anxiety   . Cancer ([place])    skin  . Depression   . Diabetes mellitus without complication ([place])   . Hypertension   . Hypothyroidism   . Sleep apnea   . Thyroid disease     Past Surgical History:  Procedure Laterality Date  . APPENDECTOMY    . BACK SURGERY     L4, L5  . BIOPSY  02/10/2017   Procedure: BIOPSY;  Surgeon: Rogene Houston, MD;  [facility];  Service: Endoscopy;;  gastric   . BREAST ENHANCEMENT SURGERY    . CESAREAN SECTION    . ESOPHAGOGASTRODUODENOSCOPY N/A 02/10/2017   Procedure: ESOPHAGOGASTRODUODENOSCOPY (EGD);  Surgeon: Rogene Houston, MD;  [facility];  Service: Endoscopy;  Laterality: N/A;  . eye lid surgery    . ROTATOR CUFF REPAIR      Allergies  Allergen Reactions  . Lisinopril Anaphylaxis    hypotension  . Celebrex [Celecoxib]     Stomach cramps     Current Outpatient Medications on File Prior to Visit  Medication Sig Dispense Refill  . ALPRAZolam (XANAX) 0.5 MG tablet Take 0.5 mg by mouth at bedtime as needed for anxiety.    . butalbital-aspirin-caffeine-codeine (FIORINAL WITH CODEINE) 50-325-40-30 MG capsule Take 1 capsule by mouth 3 (three) times daily as needed for pain or headache.    . Calcium Citrate (CITRACAL PO) Take 1 tablet by mouth once a week.     . denosumab (PROLIA) 60 MG/ML SOLN injection Inject 60 mg into the skin every 6 (six) months. Administer in upper arm, thigh, or abdomen    . Docusate Calcium (STOOL SOFTENER PO) Take 2-3 tablets by mouth at bedtime.    . gabapentin (NEURONTIN) 300 MG capsule One tab PO qHS for a week, then BID for a week, then TID. May double weekly to a max of 3,600mg /day (Patient  taking differently: Take 300 mg by mouth 3 (three) times daily. ) 180 capsule 3  . HYDROcodone-acetaminophen (NORCO) 10-325 MG per tablet Take 1 tablet by mouth every 6 (six) hours as needed for moderate pain.     Marland Kitchen levothyroxine (SYNTHROID, LEVOTHROID) 75 MCG tablet Take 1 tablet (75 mcg total) by mouth every morning. 90 tablet 1  . losartan (COZAAR) 100 MG tablet Take 100 mg by mouth daily.    . pantoprazole (PROTONIX) 40 MG tablet Take 1 tablet (40 mg total) by mouth 2 (two) times daily before a meal. 60 tablet 2  . Polyvinyl Alcohol-Povidone (REFRESH OP) Apply 1 drop to eye daily.    . rizatriptan (MAXALT-MLT) 5 MG disintegrating tablet Take 5 mg by mouth as needed for migraine. May repeat in 2 hours if needed    . tiZANidine (ZANAFLEX) 2 MG tablet Take 2 mg by mouth daily as needed for muscle spasms.     Marland Kitchen  venlafaxine XR (EFFEXOR-XR) 150 MG 24 hr capsule Take 150 mg by mouth at bedtime.     . Vitamin D, Ergocalciferol, (DRISDOL) 50000 units CAPS capsule Take 50,000 Units by mouth 2 (two) times a week.      No current facility-administered medications on file prior to visit.         Objective:   Physical Exam Blood pressure (!) 150/90, pulse 60, temperature 97.7 F (36.5 C), height 5\' 1"  (1.549 m), weight 138 lb 14.4 oz (63 kg). Alert and oriented. Skin warm and dry. Oral mucosa is moist.   . Sclera anicteric, conjunctivae is pink. Thyroid not enlarged. No cervical lymphadenopathy. Lungs clear. Heart regular rate and rhythm.  Abdomen is soft. Bowel sounds are positive. No hepatomegaly. No abdominal masses felt. No tenderness.  No edema to lower extremities.           Assessment & Plan:  IDA. CBC in 2018 was normal. Will get a H and H  today. Will put on a recall for a colonoscopy in March of 2020.

## 2018-05-29 ENCOUNTER — Other Ambulatory Visit (INDEPENDENT_AMBULATORY_CARE_PROVIDER_SITE_OTHER): Payer: Self-pay | Admitting: Internal Medicine

## 2018-11-04 LAB — BASIC METABOLIC PANEL
BUN: 13 (ref 4–21)
Creatinine: 0.7
Potassium: 4.5 (ref 3.4–5.3)
Sodium: 140 (ref 137–147)

## 2018-11-04 LAB — HEMOGLOBIN A1C: Hemoglobin A1C: 5.6

## 2018-11-04 LAB — TSH: TSH: 1.06 (ref 0.41–5.90)

## 2018-11-04 LAB — HEPATIC FUNCTION PANEL
ALT: 12
AST: 15

## 2018-11-05 ENCOUNTER — Encounter (INDEPENDENT_AMBULATORY_CARE_PROVIDER_SITE_OTHER): Payer: Self-pay | Admitting: *Deleted

## 2018-11-09 ENCOUNTER — Ambulatory Visit: Payer: BLUE CROSS/BLUE SHIELD | Admitting: "Endocrinology

## 2018-11-10 ENCOUNTER — Encounter: Payer: Self-pay | Admitting: "Endocrinology

## 2018-11-10 ENCOUNTER — Ambulatory Visit (INDEPENDENT_AMBULATORY_CARE_PROVIDER_SITE_OTHER): Payer: BLUE CROSS/BLUE SHIELD | Admitting: "Endocrinology

## 2018-11-10 VITALS — BP 122/85 | HR 71 | Resp 12 | Ht 60.0 in | Wt 142.6 lb

## 2018-11-10 DIAGNOSIS — E039 Hypothyroidism, unspecified: Secondary | ICD-10-CM | POA: Diagnosis not present

## 2018-11-10 DIAGNOSIS — E119 Type 2 diabetes mellitus without complications: Secondary | ICD-10-CM | POA: Diagnosis not present

## 2018-11-10 MED ORDER — LEVOTHYROXINE SODIUM 75 MCG PO TABS: 75.0000 ug | ORAL_TABLET | Freq: Every morning | ORAL | 0 refills | Status: DC

## 2018-11-10 NOTE — Progress Notes (Signed)
Endocrinology follow-up note   Subjective:    Patient ID: Lori [name], adult    DOB: 1955-09-29,   Past Medical History:  Diagnosis Date  . Anemia   . Anxiety   . Cancer ([place])    skin  . Depression   . Diabetes mellitus without complication ([place])   . Hypertension   . Hypothyroidism   . Sleep apnea   . Thyroid disease    Past Surgical History:  Procedure Laterality Date  . APPENDECTOMY    . BACK SURGERY     L4, L5  . BIOPSY  02/10/2017   Procedure: BIOPSY;  Surgeon: Rogene Houston, MD;  [facility]: [facility];  Service: Endoscopy;;  gastric   . BREAST ENHANCEMENT SURGERY    . CESAREAN SECTION    . ESOPHAGOGASTRODUODENOSCOPY N/A 02/10/2017   Procedure: ESOPHAGOGASTRODUODENOSCOPY (EGD);  Surgeon: Rogene Houston, MD;  Location: [facility];  Service: Endoscopy;  Laterality: N/A;  . eye lid surgery    . ROTATOR CUFF REPAIR     Social History   Socioeconomic History  . Marital status: Married    Spouse name: Not on file  . Number of children: Not on file  . Years of education: Not on file  . Highest education level: Not on file  Occupational History  . Not on file  Social Needs  . Financial resource strain: Not on file  . Food insecurity:    Worry: Not on file    Inability: Not on file  . Transportation needs:    Medical: Not on file    Non-medical: Not on file  Tobacco Use  . Smoking status: Former Smoker    Last attempt to quit: 09/12/2012    Years since quitting: 6.1  . Smokeless tobacco: Never Used  . Tobacco comment: Chews nicorette gum  Substance and Sexual Activity  . Alcohol use: No  . Drug use: No  . Sexual activity: Never  Lifestyle  . Physical activity:    Days per week: Not on file    Minutes per session: Not on file  . Stress: Not on file  Relationships  . Social connections:    Talks on phone: Not on file    Gets together: Not on file    Attends religious service: Not on file    Active member of club or organization: Not on  file    Attends meetings of clubs or organizations: Not on file    Relationship status: Not on file  Other Topics Concern  . Not on file  Social History Narrative  . Not on file   Outpatient Encounter Medications as of 11/10/2018  Medication Sig  . ALPRAZolam (XANAX) 0.5 MG tablet Take 0.5 mg by mouth at bedtime as needed for anxiety.  . butalbital-aspirin-caffeine-codeine (FIORINAL WITH CODEINE) 50-325-40-30 MG capsule Take 1 capsule by mouth 3 (three) times daily as needed for pain or headache.  . Calcium Citrate (CITRACAL PO) Take 1 tablet by mouth once a week.   . denosumab (PROLIA) 60 MG/ML SOLN injection Inject 60 mg into the skin every 6 (six) months. Administer in upper arm, thigh, or abdomen  . Docusate Calcium (STOOL SOFTENER PO) Take 2-3 tablets by mouth at bedtime.  . gabapentin (NEURONTIN) 300 MG capsule One tab PO qHS for a week, then BID for a week, then TID. May double weekly to a max of 3,600mg /day (Patient taking differently: Take 300 mg by mouth 3 (three) times daily. )  .  HYDROcodone-acetaminophen (NORCO) 10-325 MG per tablet Take 1 tablet by mouth every 6 (six) hours as needed for moderate pain.   Marland Kitchen levothyroxine (SYNTHROID, LEVOTHROID) 75 MCG tablet Take 1 tablet (75 mcg total) by mouth every morning.  Marland Kitchen losartan (COZAAR) 100 MG tablet Take 100 mg by mouth daily.  . pantoprazole (PROTONIX) 40 MG tablet TAKE 1 TABLET BY MOUTH TWICE A DAY BEFORE A MEAL  . Polyvinyl Alcohol-Povidone (REFRESH OP) Apply 1 drop to eye daily.  . rizatriptan (MAXALT-MLT) 5 MG disintegrating tablet Take 5 mg by mouth as needed for migraine. May repeat in 2 hours if needed  . tiZANidine (ZANAFLEX) 2 MG tablet Take 2 mg by mouth daily as needed for muscle spasms.   Marland Kitchen venlafaxine XR (EFFEXOR-XR) 150 MG 24 hr capsule Take 150 mg by mouth at bedtime.   . [DISCONTINUED] levothyroxine (SYNTHROID, LEVOTHROID) 75 MCG tablet Take 1 tablet (75 mcg total) by mouth every morning.  . [DISCONTINUED] Vitamin D,  Ergocalciferol, (DRISDOL) 50000 units CAPS capsule Take 50,000 Units by mouth 2 (two) times a week.    No facility-administered encounter medications on file as of 11/10/2018.    ALLERGIES: Allergies  Allergen Reactions  . Lisinopril Anaphylaxis    hypotension  . Celebrex [Celecoxib]     Stomach cramps    VACCINATION STATUS:  There is no immunization history on file for this patient.  HPI  63 year old female with medical history significant for osteoporosis, diffuse arthritis, depression/anxiety,  PUD, GERD, chronic pain, sleep disorder, polypharmacy, chronic smoking and hyponatremia. 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    She is on fluid restriction to <1500cc/day. She denies any exposure to lithium,  demeclocycline, nor Vasopressin. She feels much better.  No new complaints. Her serum Sodium has stabilized at 138 -140.  She denies dry mouth now. She has steady weight .  she is not on medications for type 2 diabetes, previsit labs show A1c of 5.6%.  She is on levothyroxine 75 mcg p.o. every morning, reports compliance.  She has no new complaints today.     Review of Systems  Constitutional:  + steady weight, no fatigue, no subjective hypo-or hyperthermia.  Eyes: no blurry vision, no xerophthalmia ENT: no sore throat, no nodules palpated in throat, no dysphagia/odynophagia, no hoarseness Cardiovascular: No chest pain, palpitations, no edema.     Musculoskeletal: no muscle/joint aches Skin: no rashes Neurological: no tremors/numbness/tingling/dizziness Psychiatric: no depression/anxiety  Objective:    BP 122/85   Pulse 71   Resp 12   Ht 5' (1.524 m)   Wt 142 lb 9.6 oz (64.7 kg)   SpO2 99%   BMI 27.85 kg/m   Wt Readings from Last 3 Encounters:  11/10/18 142 lb 9.6 oz (64.7 kg)  05/12/18 138 lb 14.4 oz (63 kg)  05/06/18 139 lb (63 kg)    Physical Exam  Constitutional:   Not  in acute distress.   Eyes: PERRLA, EOMI, no exophthalmos ENT: moist mucous membranes, no thyromegaly, no cervical lymphadenopathy Musculoskeletal: no deformities, strength intact in all 4 Skin: moist, warm, no rashes Neurological: no tremor with outstretched hands  Recent Results (from the past 2160 hour(s))  Basic metabolic panel     Status: None   Collection Time: 11/04/18 12:00 AM  Result Value Ref Range   BUN 13 4 - 21   Creatinine 0.7    Potassium 4.5 3.4 - 5.3   Sodium 140 137 -  147  Hepatic function panel     Status: None   Collection Time: 11/04/18 12:00 AM  Result Value Ref Range   ALT 12    AST 15   Hemoglobin A1c     Status: None   Collection Time: 11/04/18 12:00 AM  Result Value Ref Range   Hemoglobin A1C 5.6   TSH     Status: None   Collection Time: 11/04/18 12:00 AM  Result Value Ref Range   TSH 1.06 0.41 - 5.90    Comment:  free t4 0.96      Assessment & Plan:   1. Primary hypothyroidism -Her thyroid function tests are consistent with appropriate replacement.  She is advised to continue her current dose of levothyroxine at 75 mcg p.o. daily before breakfast.     - We discussed about the correct intake of his thyroid hormone, on empty stomach at fasting, with water, separated by at least 30 minutes from breakfast and other medications,  and separated by more than 4 hours from calcium, iron, multivitamins, acid reflux medications (PPIs). -Patient is made aware of the fact that thyroid hormone replacement is needed for life, dose to be adjusted by periodic monitoring of thyroid function tests.   2. Diabetes mellitus without complication ([place]) Her previsit labs show A1c of 5.6% remaining stable.  She is off of metformin.    - Patient admits there is a room for improvement in his diet and drink choices. -  Suggestion is made for him to avoid simple carbohydrates  from his diet including Cakes, Sweet Desserts / Pastries, Ice Cream, Soda (diet and regular), Sweet  Tea, Candies, Chips, Cookies, Store Bought Juices, Alcohol in Excess of  1-2 drinks a day, Artificial Sweeteners, and "Sugar-free" Products. This will help patient to have stable blood glucose profile and potentially avoid unintended weight gain.   3. Polydipsia/Hyponatremia  Her most recent Sodium is stable at 140. She is advised to continue moderate fluid restriction to 1500cc/24 hours. 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 She is clinically euvolemic and asymptomatic today.  Pt's prior 24 hour urine Aldosterone and cortisol were within normal limits.   Her BP is controlled to target.   she reacted to Lisinopril, but doing well on valsartan.  - I advised patient to maintain close follow up with PMD for primary care needs.  Follow up plan: Return in about 6 months (around 05/13/2019) for Follow up with Pre-visit Labs.  Glade Lloyd, MD Phone: [phone]  Fax: [phone]  -  This note was partially dictated with voice recognition software. Similar sounding words can be transcribed inadequately or may not  be corrected upon review.  11/10/2018, 10:22 AM

## 2018-12-08 ENCOUNTER — Encounter (INDEPENDENT_AMBULATORY_CARE_PROVIDER_SITE_OTHER): Payer: Self-pay

## 2018-12-17 ENCOUNTER — Other Ambulatory Visit: Payer: Self-pay

## 2018-12-17 MED ORDER — LEVOTHYROXINE SODIUM 75 MCG PO TABS: 75.0000 ug | ORAL_TABLET | Freq: Every morning | ORAL | 0 refills | Status: DC

## 2018-12-23 ENCOUNTER — Telehealth (INDEPENDENT_AMBULATORY_CARE_PROVIDER_SITE_OTHER): Payer: Self-pay | Admitting: *Deleted

## 2018-12-23 NOTE — Telephone Encounter (Signed)
After reviewing some information from [facility], Dr.Rehman wanted to make sure that the patient was taking 1 Pantoprazole daily. Patient called and she has been taking 2 per day. She was ask to start taking 1 by mouth daily. If she should have any breakthrough symptoms to please call our office. She agreed to do so.

## 2019-05-05 LAB — LIPID PANEL
Cholesterol: 246 — AB (ref 0–200)
HDL: 103 — AB (ref 35–70)
Triglycerides: 74 (ref 40–160)

## 2019-05-05 LAB — BASIC METABOLIC PANEL
BUN: 7 (ref 4–21)
Creatinine: 0.5
Glucose: 86
Potassium: 4.1 (ref 3.4–5.3)
Sodium: 134 — AB (ref 137–147)

## 2019-05-05 LAB — TSH: TSH: 0.72 (ref <=5.90)

## 2019-05-05 LAB — HEMOGLOBIN A1C: Hemoglobin A1C: 5.1

## 2019-05-05 LAB — VITAMIN D 25 HYDROXY (VIT D DEFICIENCY, FRACTURES): Vit D, 25-Hydroxy: 16.6

## 2019-05-13 ENCOUNTER — Other Ambulatory Visit: Payer: Self-pay | Admitting: Nurse Practitioner

## 2019-05-13 ENCOUNTER — Encounter: Payer: Self-pay | Admitting: "Endocrinology

## 2019-05-13 ENCOUNTER — Ambulatory Visit (INDEPENDENT_AMBULATORY_CARE_PROVIDER_SITE_OTHER): Payer: BC Managed Care – PPO | Admitting: "Endocrinology

## 2019-05-13 ENCOUNTER — Other Ambulatory Visit: Payer: Self-pay

## 2019-05-13 DIAGNOSIS — E119 Type 2 diabetes mellitus without complications: Secondary | ICD-10-CM | POA: Diagnosis not present

## 2019-05-13 DIAGNOSIS — E559 Vitamin D deficiency, unspecified: Secondary | ICD-10-CM | POA: Insufficient documentation

## 2019-05-13 DIAGNOSIS — E782 Mixed hyperlipidemia: Secondary | ICD-10-CM | POA: Insufficient documentation

## 2019-05-13 DIAGNOSIS — E039 Hypothyroidism, unspecified: Secondary | ICD-10-CM | POA: Diagnosis not present

## 2019-05-13 DIAGNOSIS — R631 Polydipsia: Secondary | ICD-10-CM | POA: Diagnosis not present

## 2019-05-13 MED ORDER — VITAMIN D3 125 MCG (5000 UT) PO CAPS: 5000.0000 [IU] | ORAL_CAPSULE | Freq: Every day | ORAL | 1 refills | Status: AC

## 2019-05-13 MED ORDER — LEVOTHYROXINE SODIUM 75 MCG PO TABS: 75.0000 ug | ORAL_TABLET | Freq: Every morning | ORAL | 0 refills | Status: AC

## 2019-05-13 NOTE — Progress Notes (Signed)
05/13/2019                                Endocrinology Telehealth Visit Follow up Note -During COVID -19 Pandemic  I connected with Lori [name] on 05/13/2019   by telephone and verified that I am speaking with the correct person using two identifiers. Lori [name], [id number]. he has verbally consented to this visit. All issues noted in this document were discussed and addressed. The format was not optimal for physical exam.  Subjective:    Patient ID: Lori [name], adult    DOB: June 29, 1956,   Past Medical History:  Diagnosis Date  . Anemia   . Anxiety   . Cancer ([place])    skin  . Depression   . Diabetes mellitus without complication ([place])   . Hypertension   . Hypothyroidism   . Sleep apnea   . Thyroid disease    Past Surgical History:  Procedure Laterality Date  . APPENDECTOMY    . BACK SURGERY     L4, L5  . BIOPSY  02/10/2017   Procedure: BIOPSY;  Surgeon: Rogene Houston, MD;  [facility];  Service: Endoscopy;;  gastric   . BREAST ENHANCEMENT SURGERY    . CESAREAN SECTION    . ESOPHAGOGASTRODUODENOSCOPY N/A 02/10/2017   Procedure: ESOPHAGOGASTRODUODENOSCOPY (EGD);  Surgeon: Rogene Houston, MD;  [facility];  Service: Endoscopy;  Laterality: N/A;  . eye lid surgery    . ROTATOR CUFF REPAIR     Social History   Socioeconomic History  . Marital status: Married    Spouse name: Not on file  . Number of children: Not on file  . Years of education: Not on file  . Highest education level: Not on file  Occupational History  . Not on file  Social Needs  . Financial resource strain: Not on file  . Food insecurity    Worry: Not on file    Inability: Not on file  . Transportation needs    Medical: Not on file    Non-medical: Not on file  Tobacco Use  . Smoking status: Former Smoker    Quit date: 09/12/2012    Years since quitting: 6.6  . Smokeless tobacco: Never Used  . Tobacco comment: Chews nicorette gum  Substance and Sexual  Activity  . Alcohol use: No  . Drug use: No  . Sexual activity: Never  Lifestyle  . Physical activity    Days per week: Not on file    Minutes per session: Not on file  . Stress: Not on file  Relationships  . Social Herbalist on phone: Not on file    Gets together: Not on file    Attends religious service: Not on file    Active member of club or organization: Not on file    Attends meetings of clubs or organizations: Not on file    Relationship status: Not on file  Other Topics Concern  . Not on file  Social History Narrative  . Not on file   Outpatient Encounter Medications as of 05/13/2019  Medication Sig  . ALPRAZolam (XANAX) 0.5 MG tablet Take 0.5 mg by mouth at bedtime as needed for anxiety.  . butalbital-aspirin-caffeine-codeine (FIORINAL WITH CODEINE) 50-325-40-30 MG capsule Take 1 capsule by mouth 3 (three) times daily as needed for pain or headache.  . Calcium Citrate (CITRACAL PO) Take  1 tablet by mouth once a week.   . Cholecalciferol (VITAMIN D3) 125 MCG (5000 UT) CAPS Take 1 capsule (5,000 Units total) by mouth daily.  Marland Kitchen denosumab (PROLIA) 60 MG/ML SOLN injection Inject 60 mg into the skin every 6 (six) months. Administer in upper arm, thigh, or abdomen  . Docusate Calcium (STOOL SOFTENER PO) Take 2-3 tablets by mouth at bedtime.  . gabapentin (NEURONTIN) 300 MG capsule One tab PO qHS for a week, then BID for a week, then TID. May double weekly to a max of 3,600mg /day (Patient taking differently: Take 300 mg by mouth 3 (three) times daily. )  . HYDROcodone-acetaminophen (NORCO) 10-325 MG per tablet Take 1 tablet by mouth every 6 (six) hours as needed for moderate pain.   Marland Kitchen levothyroxine (SYNTHROID) 75 MCG tablet Take 1 tablet (75 mcg total) by mouth every morning.  Marland Kitchen losartan (COZAAR) 100 MG tablet Take 100 mg by mouth daily.  . pantoprazole (PROTONIX) 40 MG tablet TAKE 1 TABLET BY MOUTH TWICE A DAY BEFORE A MEAL  . Polyvinyl Alcohol-Povidone (REFRESH OP)  Apply 1 drop to eye daily.  . rizatriptan (MAXALT-MLT) 5 MG disintegrating tablet Take 5 mg by mouth as needed for migraine. May repeat in 2 hours if needed  . tiZANidine (ZANAFLEX) 2 MG tablet Take 2 mg by mouth daily as needed for muscle spasms.   Marland Kitchen venlafaxine XR (EFFEXOR-XR) 150 MG 24 hr capsule Take 150 mg by mouth at bedtime.   . [DISCONTINUED] levothyroxine (SYNTHROID, LEVOTHROID) 75 MCG tablet Take 1 tablet (75 mcg total) by mouth every morning.   No facility-administered encounter medications on file as of 05/13/2019.    ALLERGIES: Allergies  Allergen Reactions  . Lisinopril Anaphylaxis    hypotension  . Celebrex [Celecoxib]     Stomach cramps    VACCINATION STATUS:  There is no immunization history on file for this patient.  HPI  63 year old female with medical history significant for osteoporosis, diffuse arthritis, depression/anxiety,  PUD, GERD, chronic pain, sleep disorder, polypharmacy, chronic smoking and hyponatremia. 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    She is on fluid restriction to <1500cc/day. She denies any exposure to lithium,  demeclocycline, nor Vasopressin. She feels much better.  No new complaints. Her serum Sodium has stabilized at 134 -140.  She denies dry mouth now. She has steady weight .  she is not on medications for type 2 diabetes, previsit labs show A1c of 5.1%.  She is on levothyroxine 75 mcg p.o. every morning, reports compliance.  She has no new complaints today.     Review of Systems Limited as above.  Objective:    There were no vitals taken for this visit.  Wt Readings from Last 3 Encounters:  11/10/18 142 lb 9.6 oz (64.7 kg)  05/12/18 138 lb 14.4 oz (63 kg)  05/06/18 139 lb (63 kg)    Physical Exam   Recent Results (from the past 2160 hour(s))  VITAMIN D 25 Hydroxy (Vit-D Deficiency, Fractures)     Status: None   Collection Time:  05/05/19 12:00 AM  Result Value Ref Range   Vit D, 25-Hydroxy 99991111   Basic metabolic panel     Status: Abnormal   Collection Time: 05/05/19 12:00 AM  Result Value Ref Range   Glucose 86    BUN 7 4 - 21   Creatinine 0.5    Potassium 4.1 3.4 - 5.3  Sodium 134 (A) 137 - 147  Lipid panel     Status: Abnormal   Collection Time: 05/05/19 12:00 AM  Result Value Ref Range   Triglycerides 74 40 - 160   Cholesterol 246 (A) 0 - 200   HDL 103 (A) 35 - 70  TSH     Status: None   Collection Time: 05/05/19 12:00 AM  Result Value Ref Range   TSH 0.72 0.41 - 5.90    Comment: Free T4 - 1.33  Hemoglobin A1c     Status: None   Collection Time: 05/05/19 12:00 AM  Result Value Ref Range   Hemoglobin A1C 5.1       Assessment & Plan:   1. Primary hypothyroidism -Her thyroid function tests are consistent with appropriate replacement.  She is advised to continue her current dose of levothyroxine at 75 mcg p.o. daily before breakfast.    - We discussed about the correct intake of his thyroid hormone, on empty stomach at fasting, with water, separated by at least 30 minutes from breakfast and other medications,  and separated by more than 4 hours from calcium, iron, multivitamins, acid reflux medications (PPIs). -Patient is made aware of the fact that thyroid hormone replacement is needed for life, dose to be adjusted by periodic monitoring of thyroid function tests.   2. Diabetes mellitus without complication ([place]) Her previsit labs show A1c of 5.1% remaining stable.  She is off of metformin.    - he  admits there is a room for improvement in his diet and drink choices. -  Suggestion is made for him to avoid simple carbohydrates  from his diet including Cakes, Sweet Desserts / Pastries, Ice Cream, Soda (diet and regular), Sweet Tea, Candies, Chips, Cookies, Sweet Pastries,  Store Bought Juices, Alcohol in Excess of  1-2 drinks a day, Artificial Sweeteners, Coffee Creamer, and "Sugar-free" Products.  This will help patient to have stable blood glucose profile and potentially avoid unintended weight gain.   3. Polydipsia/Hyponatremia  Her most recent Sodium is stable at 140. She is advised to continue moderate fluid restriction to 1500cc/24 hours. 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 She is clinically euvolemic and asymptomatic today.  4.  Vitamin D deficiency I discussed initiated vitamin D 3 5000 units daily, until her next measurement in 6 months.   5.  Hyperlipidemia Her LDL is uncontrolled at 128, no prior history of hyperlipidemia, no family history of premature coronary disease.  She is advised to cut back on fried foods, butter, eggs. She will  Have repeat fasting lipid panel before her next visit. She will be considered for statin treatment if her LDL remains above 100 and expected.   - I advised patient to maintain close follow up with PMD for primary care needs.   - Patient Care Time Today:  25 min, of which >50% was spent in  counseling and the rest reviewing his  current and  previous labs/studies, previous treatments, and medications' doses and developing a plan for long-term care based on the latest recommendations for standards of care.   Lori [name] participated in the discussions, expressed understanding, and voiced agreement with the above plans.  All questions were answered to his satisfaction. he is encouraged to contact clinic should he have any questions or concerns prior to his return visit.  Follow up plan: Return in about 6 months (around 11/10/2019) for Follow up with Pre-visit Labs.  Glade Lloyd, MD Phone: [phone]  Fax: [phone]  -  This note was partially dictated with voice recognition software. Similar sounding words can be transcribed inadequately or may not  be corrected upon review.  05/13/2019, 12:36 PM

## 2019-11-15 ENCOUNTER — Ambulatory Visit: Payer: BC Managed Care – PPO | Admitting: "Endocrinology

## 2019-11-16 ENCOUNTER — Other Ambulatory Visit: Payer: Self-pay | Admitting: "Endocrinology

## 2019-11-17 LAB — LIPID PANEL W/O CHOL/HDL RATIO
Cholesterol, Total: 165 mg/dL (ref 100–199)
HDL: 99 mg/dL (ref >39)
LDL Chol Calc (NIH): 50 mg/dL (ref 0–99)
Triglycerides: 92 mg/dL (ref 0–149)
VLDL Cholesterol Cal: 16 mg/dL (ref 5–40)

## 2019-11-17 LAB — TSH+FREE T4
Free T4: 1.25 ng/dL (ref 0.82–1.77)
TSH: 1.47 u[IU]/mL (ref 0.450–4.500)

## 2019-11-17 LAB — VITAMIN D 25 HYDROXY (VIT D DEFICIENCY, FRACTURES): Vit D, 25-Hydroxy: 61.2 ng/mL (ref 30.0–100.0)

## 2019-11-23 ENCOUNTER — Encounter: Payer: Self-pay | Admitting: "Endocrinology

## 2019-11-23 ENCOUNTER — Ambulatory Visit (INDEPENDENT_AMBULATORY_CARE_PROVIDER_SITE_OTHER): Payer: BC Managed Care – PPO | Admitting: "Endocrinology

## 2019-11-23 DIAGNOSIS — R631 Polydipsia: Secondary | ICD-10-CM

## 2019-11-23 DIAGNOSIS — E039 Hypothyroidism, unspecified: Secondary | ICD-10-CM | POA: Diagnosis not present

## 2019-11-23 DIAGNOSIS — E119 Type 2 diabetes mellitus without complications: Secondary | ICD-10-CM | POA: Diagnosis not present

## 2019-11-23 NOTE — Progress Notes (Signed)
11/23/2019                                    Endocrinology Telehealth Visit Follow up Note -During COVID -19 Pandemic  I connected with Lori [name] on 11/23/2019   by telephone and verified that I am speaking with the correct person using two identifiers. Lori [name], 29-Jun-1956. she has verbally consented to this visit. All issues noted in this document were discussed and addressed. The format was not optimal for physical exam.  Subjective:    Patient ID: Lori [name], adult    DOB: 1956-05-14,   Past Medical History:  Diagnosis Date  . Anemia   . Anxiety   . Cancer ([place])    skin  . Depression   . Diabetes mellitus without complication ([place])   . Hypertension   . Hypothyroidism   . Sleep apnea   . Thyroid disease    Past Surgical History:  Procedure Laterality Date  . APPENDECTOMY    . BACK SURGERY     L4, L5  . BIOPSY  02/10/2017   Procedure: BIOPSY;  Surgeon: Rogene Houston, MD;  Location: [facility];  Service: Endoscopy;;  gastric   . BREAST ENHANCEMENT SURGERY    . CESAREAN SECTION    . ESOPHAGOGASTRODUODENOSCOPY N/A 02/10/2017   Procedure: ESOPHAGOGASTRODUODENOSCOPY (EGD);  Surgeon: Rogene Houston, MD;  Location: [facility];  Service: Endoscopy;  Laterality: N/A;  . eye lid surgery    . ROTATOR CUFF REPAIR     Social History   Socioeconomic History  . Marital status: Married    Spouse name: Not on file  . Number of children: Not on file  . Years of education: Not on file  . Highest education level: Not on file  Occupational History  . Not on file  Tobacco Use  . Smoking status: Former Smoker    Quit date: 09/12/2012    Years since quitting: 7.2  . Smokeless tobacco: Never Used  . Tobacco comment: Chews nicorette gum  Substance and Sexual Activity  . Alcohol use: No  . Drug use: No  . Sexual activity: Never  Other Topics Concern  . Not on file  Social History Narrative  . Not on file   Social Determinants of Health   Financial  Resource Strain:   . Difficulty of Paying Living Expenses:   Food Insecurity:   . Worried About Charity fundraiser in the Last Year:   . Arboriculturist in the Last Year:   Transportation Needs:   . Film/video editor (Medical):   Marland Kitchen Lack of Transportation (Non-Medical):   Physical Activity:   . Days of Exercise per Week:   . Minutes of Exercise per Session:   Stress:   . Feeling of Stress :   Social Connections:   . Frequency of Communication with Friends and Family:   . Frequency of Social Gatherings with Friends and Family:   . Attends Religious Services:   . Active Member of Clubs or Organizations:   . Attends Archivist Meetings:   Marland Kitchen Marital Status:    Outpatient Encounter Medications as of 11/23/2019  Medication Sig  . ALPRAZolam (XANAX) 0.5 MG tablet Take 0.5 mg by mouth at bedtime as needed for anxiety.  . butalbital-aspirin-caffeine-codeine (FIORINAL WITH CODEINE) 50-325-40-30 MG capsule Take 1 capsule by mouth 3 (three) times daily as  needed for pain or headache.  . Calcium Citrate (CITRACAL PO) Take 1 tablet by mouth once a week.   . Cholecalciferol (VITAMIN D3) 125 MCG (5000 UT) CAPS Take 1 capsule (5,000 Units total) by mouth daily.  Marland Kitchen denosumab (PROLIA) 60 MG/ML SOLN injection Inject 60 mg into the skin every 6 (six) months. Administer in upper arm, thigh, or abdomen  . Docusate Calcium (STOOL SOFTENER PO) Take 2-3 tablets by mouth at bedtime.  . gabapentin (NEURONTIN) 300 MG capsule One tab PO qHS for a week, then BID for a week, then TID. May double weekly to a max of 3,600mg /day (Patient taking differently: Take 300 mg by mouth 3 (three) times daily. )  . HYDROcodone-acetaminophen (NORCO) 10-325 MG per tablet Take 1 tablet by mouth every 6 (six) hours as needed for moderate pain.   Marland Kitchen levothyroxine (SYNTHROID) 75 MCG tablet Take 1 tablet (75 mcg total) by mouth every morning.  Marland Kitchen losartan (COZAAR) 100 MG tablet Take 100 mg by mouth daily.  . pantoprazole  (PROTONIX) 40 MG tablet TAKE 1 TABLET BY MOUTH TWICE A DAY BEFORE A MEAL  . Polyvinyl Alcohol-Povidone (REFRESH OP) Apply 1 drop to eye daily.  . rizatriptan (MAXALT-MLT) 5 MG disintegrating tablet Take 5 mg by mouth as needed for migraine. May repeat in 2 hours if needed  . tiZANidine (ZANAFLEX) 2 MG tablet Take 2 mg by mouth daily as needed for muscle spasms.   Marland Kitchen venlafaxine XR (EFFEXOR-XR) 150 MG 24 hr capsule Take 150 mg by mouth at bedtime.    No facility-administered encounter medications on file as of 11/23/2019.   ALLERGIES: Allergies  Allergen Reactions  . Lisinopril Anaphylaxis    hypotension  . Celebrex [Celecoxib]     Stomach cramps    VACCINATION STATUS:  There is no immunization history on file for this patient.  HPI  64 year old female with medical history significant for osteoporosis, diffuse arthritis, depression/anxiety,  PUD, GERD, chronic pain, sleep disorder, polypharmacy, chronic smoking and hyponatremia. 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  No new complaints today. She is on fluid restriction to <1500cc/day. She denies any exposure to lithium,  demeclocycline, nor Vasopressin. She feels much better.  Her serum Sodium has stabilized at 134 -140.  She denies dry mouth now. She has steady weight .  she is not on medications for type 2 diabetes, previsit labs show A1c of 5.1%.  She is on levothyroxine 75 mcg p.o. every morning, reports compliance.  She has no new complaints today.     Review of Systems Limited as above.  Objective:    There were no vitals taken for this visit.  Wt Readings from Last 3 Encounters:  11/10/18 142 lb 9.6 oz (64.7 kg)  05/12/18 138 lb 14.4 oz (63 kg)  05/06/18 139 lb (63 kg)    Physical Exam   Recent Results (from the past 2160 hour(s))  TSH + free T4     Status: None   Collection Time:  11/16/19 10:43 AM  Result Value Ref Range   TSH 1.470 0.450 - 4.500 uIU/mL   Free T4 1.25 0.82 - 1.77 ng/dL  Lipid Panel w/o Chol/HDL Ratio     Status: None   Collection Time: 11/16/19 10:43 AM  Result Value Ref Range   Cholesterol, Total 165 100 - 199 mg/dL   Triglycerides 92 0 - 149 mg/dL   HDL  99 >39 mg/dL   VLDL Cholesterol Cal 16 5 - 40 mg/dL   LDL Chol Calc (NIH) 50 0 - 99 mg/dL  VITAMIN D 25 Hydroxy (Vit-D Deficiency, Fractures)     Status: None   Collection Time: 11/16/19 10:43 AM  Result Value Ref Range   Vit D, 25-Hydroxy 61.2 30.0 - 100.0 ng/mL    Comment: Vitamin D deficiency has been defined by the [place] practice guideline as a level of serum 25-OH vitamin D less than 20 ng/mL (1,2). The Endocrine Society went on to further define vitamin D insufficiency as a level between 21 and 29 ng/mL (2). 1. IOM (Institute of Medicine). 2010. Dietary reference    intakes for calcium and D. [place]: The    [employer]. 2. Holick MF, Binkley [place], Bischoff-Ferrari HA, et al.    Evaluation, treatment, and prevention of vitamin D    deficiency: an Endocrine Society clinical practice    guideline. JCEM. 2011 Jul; 96(7):1911-30.       Assessment & Plan:   1. Primary hypothyroidism -Her thyroid function tests are consistent with appropriate replacement.  She is advised to continue levothyroxine 75 mcg p.o. daily before breakfast.     - We discussed about the correct intake of her thyroid hormone, on empty stomach at fasting, with water, separated by at least 30 minutes from breakfast and other medications,  and separated by more than 4 hours from calcium, iron, multivitamins, acid reflux medications (PPIs). -Patient is made aware of the fact that thyroid hormone replacement is needed for life, dose to be adjusted by periodic monitoring of thyroid function tests.   2. Diabetes mellitus without complication ([place]) Her  previsit labs show A1c of 5.1% remaining stable.  She is off of metformin.    - she  admits there is a room for improvement in her diet and drink choices. -  Suggestion is made for her to avoid simple carbohydrates  from her diet including Cakes, Sweet Desserts / Pastries, Ice Cream, Soda (diet and regular), Sweet Tea, Candies, Chips, Cookies, Sweet Pastries,  Store Bought Juices, Alcohol in Excess of  1-2 drinks a day, Artificial Sweeteners, Coffee Creamer, and "Sugar-free" Products. This will help patient to have stable blood glucose profile and potentially avoid unintended weight gain.    3. Polydipsia/Hyponatremia  Her most recent Sodium is stable at 140. She is advised to continue moderate fluid restriction to 1500cc/24 hours. 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 She is clinically euvolemic and asymptomatic today.  4.  Vitamin D deficiency She is status post treatment with vitamin D adequately.  She will have repeat vitamin D measurements 6 months from now.   5.  Hyperlipidemia Her LDL is uncontrolled at 128, no prior history of hyperlipidemia, no family history of premature coronary disease.  She is advised to cut back on fried foods, butter, eggs. She will  Have repeat fasting lipid panel before her next visit. She will be considered for statin treatment if her LDL remains above 100 and expected.   - I advised patient to maintain close follow up with PMD for primary care needs.      - Time spent on this patient care encounter:  25 minutes of which 50% was spent in  counseling and the rest reviewing  her current and  previous labs / studies and medications  doses and developing a plan  for long term care. Lori [name]  participated in the discussions, expressed understanding, and voiced agreement with the above plans.  All questions were answered to her satisfaction. she is encouraged to contact clinic  should she have any questions or concerns prior to her return visit.   Follow up plan: Return in about 6 months (around 05/25/2020) for Follow up with Pre-visit Labs, Next Visit A1c in Office.  Glade Lloyd, MD Phone: [phone]  Fax: [phone]  -  This note was partially dictated with voice recognition software. Similar sounding words can be transcribed inadequately or may not  be corrected upon review.  11/23/2019, 3:32 PM

## 2020-05-09 LAB — TSH: TSH: 0.81 (ref 0.41–5.90)

## 2020-05-09 LAB — HEMOGLOBIN A1C: Hemoglobin A1C: 5.4

## 2020-05-09 LAB — COMPREHENSIVE METABOLIC PANEL
Calcium: 9.1 (ref 8.7–10.7)
GFR calc Af Amer: 109
GFR calc non Af Amer: 94

## 2020-05-09 LAB — BASIC METABOLIC PANEL
BUN: 9 (ref 4–21)
Creatinine: 0.7

## 2020-05-10 LAB — LIPID PANEL
Cholesterol: 235 — AB (ref 0–200)
HDL: 105 — AB (ref 35–70)
LDL Cholesterol: 118
Triglycerides: 71 (ref 40–160)

## 2020-05-26 ENCOUNTER — Ambulatory Visit: Payer: BC Managed Care – PPO | Admitting: "Endocrinology
